# Patient Record
Sex: Female | Born: 1970 | Race: White | Hispanic: No | Marital: Married | Smoking: Former smoker
Health system: Southern US, Community
[De-identification: ages and names within clinical notes are randomized; demographics above are authoritative.]

## PROBLEM LIST (undated history)

## (undated) DIAGNOSIS — Z8619 Personal history of other infectious and parasitic diseases: Secondary | ICD-10-CM

## (undated) DIAGNOSIS — M5030 Other cervical disc degeneration, unspecified cervical region: Secondary | ICD-10-CM

## (undated) DIAGNOSIS — G43909 Migraine, unspecified, not intractable, without status migrainosus: Secondary | ICD-10-CM

## (undated) DIAGNOSIS — Z8489 Family history of other specified conditions: Secondary | ICD-10-CM

## (undated) DIAGNOSIS — R87619 Unspecified abnormal cytological findings in specimens from cervix uteri: Secondary | ICD-10-CM

## (undated) DIAGNOSIS — F32A Depression, unspecified: Secondary | ICD-10-CM

## (undated) DIAGNOSIS — R112 Nausea with vomiting, unspecified: Secondary | ICD-10-CM

## (undated) DIAGNOSIS — F329 Major depressive disorder, single episode, unspecified: Secondary | ICD-10-CM

## (undated) DIAGNOSIS — R42 Dizziness and giddiness: Secondary | ICD-10-CM

## (undated) DIAGNOSIS — T753XXA Motion sickness, initial encounter: Secondary | ICD-10-CM

## (undated) DIAGNOSIS — Z9889 Other specified postprocedural states: Secondary | ICD-10-CM

## (undated) DIAGNOSIS — N63 Unspecified lump in unspecified breast: Secondary | ICD-10-CM

## (undated) DIAGNOSIS — F419 Anxiety disorder, unspecified: Secondary | ICD-10-CM

## (undated) DIAGNOSIS — N809 Endometriosis, unspecified: Secondary | ICD-10-CM

## (undated) DIAGNOSIS — T7840XA Allergy, unspecified, initial encounter: Secondary | ICD-10-CM

## (undated) DIAGNOSIS — M5020 Other cervical disc displacement, unspecified cervical region: Secondary | ICD-10-CM

## (undated) DIAGNOSIS — N926 Irregular menstruation, unspecified: Secondary | ICD-10-CM

## (undated) HISTORY — PX: CERVIX LESION DESTRUCTION: SHX591

## (undated) HISTORY — DX: Major depressive disorder, single episode, unspecified: F32.9

## (undated) HISTORY — DX: Unspecified abnormal cytological findings in specimens from cervix uteri: R87.619

## (undated) HISTORY — DX: Allergy, unspecified, initial encounter: T78.40XA

## (undated) HISTORY — PX: BILATERAL SALPINGOOPHORECTOMY: SHX1223

## (undated) HISTORY — DX: Depression, unspecified: F32.A

## (undated) HISTORY — DX: Anxiety disorder, unspecified: F41.9

## (undated) HISTORY — DX: Unspecified lump in unspecified breast: N63.0

## (undated) HISTORY — DX: Endometriosis, unspecified: N80.9

## (undated) HISTORY — DX: Personal history of other infectious and parasitic diseases: Z86.19

## (undated) HISTORY — PX: LAPAROSCOPIC SUPRACERVICAL HYSTERECTOMY: SUR797

## (undated) HISTORY — DX: Irregular menstruation, unspecified: N92.6

---

## 1998-07-14 HISTORY — PX: REDUCTION MAMMAPLASTY: SUR839

## 1998-07-14 HISTORY — PX: BREAST ENHANCEMENT SURGERY: SHX7

## 2002-07-14 HISTORY — PX: LEEP: SHX91

## 2003-02-20 HISTORY — PX: COLPOSCOPY: SHX161

## 2006-07-14 HISTORY — PX: TUBAL LIGATION: SHX77

## 2006-09-10 ENCOUNTER — Ambulatory Visit: Payer: Self-pay | Admitting: Unknown Physician Specialty

## 2006-09-10 HISTORY — PX: HYSTEROSCOPY: SHX211

## 2006-09-10 HISTORY — PX: ENDOMETRIAL ABLATION: SHX621

## 2006-09-10 HISTORY — PX: TUBAL LIGATION: SHX77

## 2006-11-03 HISTORY — PX: LARYNGOSCOPY: SUR817

## 2007-08-02 ENCOUNTER — Ambulatory Visit: Payer: Self-pay | Admitting: Unknown Physician Specialty

## 2007-08-18 ENCOUNTER — Ambulatory Visit: Payer: Self-pay | Admitting: Family Medicine

## 2009-06-27 ENCOUNTER — Ambulatory Visit: Payer: Self-pay

## 2009-08-13 HISTORY — PX: MM BREAST STEREO BIOPSY LEFT (ARMC HX): HXRAD1824

## 2009-09-11 DIAGNOSIS — Z87891 Personal history of nicotine dependence: Secondary | ICD-10-CM | POA: Insufficient documentation

## 2009-09-19 DIAGNOSIS — H60339 Swimmer's ear, unspecified ear: Secondary | ICD-10-CM | POA: Insufficient documentation

## 2011-08-01 ENCOUNTER — Ambulatory Visit: Payer: Self-pay | Admitting: Family Medicine

## 2011-08-02 ENCOUNTER — Emergency Department: Payer: Self-pay | Admitting: Emergency Medicine

## 2011-08-02 LAB — URINALYSIS, COMPLETE
Bacteria: NONE SEEN
Bilirubin,UR: NEGATIVE
Blood: NEGATIVE
Glucose,UR: NEGATIVE mg/dL (ref 0–75)
Ketone: NEGATIVE
Protein: NEGATIVE
Specific Gravity: 1.005 (ref 1.003–1.030)
WBC UR: NONE SEEN /HPF (ref 0–5)

## 2011-08-02 LAB — COMPREHENSIVE METABOLIC PANEL
Albumin: 4.1 g/dL (ref 3.4–5.0)
Alkaline Phosphatase: 30 U/L — ABNORMAL LOW (ref 50–136)
Anion Gap: 12 (ref 7–16)
BUN: 11 mg/dL (ref 7–18)
Calcium, Total: 8.6 mg/dL (ref 8.5–10.1)
Chloride: 104 mmol/L (ref 98–107)
Creatinine: 0.8 mg/dL (ref 0.60–1.30)
EGFR (African American): >60
EGFR (Non-African Amer.): >60
Glucose: 90 mg/dL (ref 65–99)
Osmolality: 286 (ref 275–301)
Potassium: 3.7 mmol/L (ref 3.5–5.1)
SGOT(AST): 21 U/L (ref 15–37)
Sodium: 144 mmol/L (ref 136–145)
Total Protein: 6.9 g/dL (ref 6.4–8.2)

## 2011-08-02 LAB — CBC
HCT: 38.3 % (ref 35.0–47.0)
HGB: 13 g/dL (ref 12.0–16.0)
MCV: 87 fL (ref 80–100)
RBC: 4.4 10*6/uL (ref 3.80–5.20)
RDW: 13.4 % (ref 11.5–14.5)
WBC: 3.9 10*3/uL (ref 3.6–11.0)

## 2011-08-02 LAB — PREGNANCY, URINE: Pregnancy Test, Urine: NEGATIVE m[IU]/mL

## 2011-08-13 ENCOUNTER — Ambulatory Visit: Payer: Self-pay | Admitting: General Surgery

## 2011-08-13 HISTORY — PX: APPENDECTOMY: SHX54

## 2011-08-14 LAB — PATHOLOGY REPORT

## 2011-09-18 ENCOUNTER — Ambulatory Visit: Payer: Self-pay | Admitting: Unknown Physician Specialty

## 2011-11-06 ENCOUNTER — Ambulatory Visit: Payer: Self-pay | Admitting: Obstetrics & Gynecology

## 2011-11-06 LAB — CBC
MCH: 29.9 pg (ref 26.0–34.0)
MCV: 87 fL (ref 80–100)
Platelet: 219 10*3/uL (ref 150–440)
WBC: 3.7 10*3/uL (ref 3.6–11.0)

## 2011-11-12 HISTORY — PX: ABDOMINAL HYSTERECTOMY: SHX81

## 2011-11-14 ENCOUNTER — Ambulatory Visit: Payer: Self-pay | Admitting: Obstetrics & Gynecology

## 2011-11-15 LAB — HEMOGLOBIN: HGB: 11.7 g/dL — ABNORMAL LOW (ref 12.0–16.0)

## 2014-11-05 NOTE — Op Note (Signed)
PATIENT NAME:  Joanna Cervantes, Joanna Cervantes MR#:  [id number] DATE OF BIRTH:  [birth year]  DATE OF PROCEDURE:  08/13/2011  PREOPERATIVE DIAGNOSIS: Right lower quadrant pain.   POSTOPERATIVE DIAGNOSES: 1. Left side endometriosis.  2. Right ovarian cyst.   OPERATIVE PROCEDURES:  1. Diagnostic laparoscopy.  2. Drainage of right ovarian cyst.  3. Incidental appendectomy.   SURGEON: Robert Bellow, MD  ANESTHESIA: General endotracheal under Dr. Kayleen Memos.   ESTIMATED BLOOD LOSS: 10 mL.   CLINICAL NOTE: This 44 year old woman has had two weeks of right lower quadrant pain. CT scan did not show evidence of appendicitis. The patient's pain has been persistent and it was elected to proceed to diagnostic laparoscopy to help elucidate the source of her pain. Plans were for elective appendectomy to minimize any future confusion.   OPERATIVE NOTE: With the patient under adequate general endotracheal anesthesia, the abdomen was prepped with ChloraPrep and draped. Prior to prepping, a Betadine soaked sponge stick was placed into the vagina for uterine manipulation if needed. With the patient in the supine position an incision was made in the inferior aspect of the umbilicus. The fascia was incised sharply and a 12 [place] Hassan cannula passed intra-abdominally without incident. The abdomen was then insufflated with CO2 at 10 mmHg pressure. A 5 mm hypogastric step port was placed and later a 5 mm left lateral abdominal wall step port placed under direct vision. Moving the camera to the lower port showed no evidence of injury from initial port placement.   There was a small amount of free clear fluid in the pelvis. The right ovary had a walnut size cystic lesion with a hemorrhagic surface. The cyst contents were removed with blunt dissection and irrigation. Hemostasis along the thick-walled cyst edge was with electrocautery. Examination of the broad ligament on the right side did not show evidence of endometriosis. The  left tube and ovary was examined and there was a small foci of endometriosis on the inferior aspect of the left broad ligament. Bilateral tubal division was evident.   The appendix was identified. The cecum was noted to be at the midline just above the bladder. The tip of the appendix was somewhat contracted but there was no evidence of acute appendicitis. The base of the appendix was cleared and divided with two applications of a blue GIA cartridge. The mesoappendix was divided with a white vascular cartridge. The appendix was placed into an Endo Catch bag and then delivered through the umbilical port site. The abdomen was then irrigated with lactated Ringer's solution. Good hemostasis was noted at the site of the ovary area and cyst drainage. The abdomen was then desufflated and ports removed under direct vision.   The fascia at the umbilicus was closed with an 0 Maxon figure-of-eight suture. Skin incisions were closed with 4-0 Vicryl subcuticular sutures. Benzoin, Steri-Strips, Telfa, and Tegaderm dressings were applied.   The vaginal sponge stick was removed and the patient taken to the recovery room in stable condition.   ____________________________ Robert Bellow, MD jwb:cms D: 08/13/2011 15:18:01 ET T: 08/13/2011 15:58:10 ET JOB#: [id number]  cc: Robert Bellow, MD, <Dictator> R. Barnett Applebaum, MD Eusebio Me, MD Kirstie Peri Caryn Section, MD Jocelyn Lowery Amedeo Kinsman MD ELECTRONICALLY SIGNED 08/14/2011 16:01

## 2014-11-05 NOTE — Op Note (Signed)
PATIENT NAME:  Joanna Cervantes, Joanna Cervantes MR#:  [id number] DATE OF BIRTH:  1970/11/13  DATE OF PROCEDURE:  11/14/2011  PREOPERATIVE DIAGNOSIS: Endometriosis, chronic pelvic pain.   POSTOPERATIVE DIAGNOSIS: Endometriosis, chronic pelvic pain.   PROCEDURE PERFORMED: Laparoscopic supracervical hysterectomy with bilateral salpingo-oophorectomy.   SURGEON: Glean Salen, M.D.   ANESTHESIA: General.   ESTIMATED BLOOD LOSS: Minimal.   COMPLICATIONS: None.   FINDINGS: Endometriosis particularly in the left uterosacral ligament area as well as with the ovary.   DISPOSITION: To the recovery room in stable condition.   TECHNIQUE: The patient is prepped and draped in the usual sterile fashion after adequate anesthesia is obtained in the dorsal lithotomy position. A sponge stick is placed per vagina and a Foley catheter is also inserted. Attention is then turned to the abdomen where a Veress needle is inserted through a 5 mm infraumbilical incision after Marcaine is used to anesthetize the skin. Veress needle placement is confirmed using the hanging drop technique and the abdomen is then insufflated with CO2 gas. A 5 mm trocar is then inserted under direct visualization with the laparoscope with no injuries or bleeding noted. The patient is placed in Trendelenburg positioning. A 5 mm trocar is placed in the left lower quadrant and an 11 mm trocar is placed in the right lower quadrant lateral to the inferior epigastric blood vessels with no injuries or bleeding noted. The uterus is grasped as well as the adnexa. The infundibulopelvic blood vessels and ligaments are carefully coagulated and cut on each side. Dissection is carried to excise the tube and ovary to the level of the uterus and then the cardinal ligament and broad ligament complex is dissected down to the level of the uterine arteries. The uterine arteries are cauterized using bipolar cautery device. The uterus is then amputated at the level of the cervix.  The endocervical canal is cauterized. An approximate 1 cm of cervix is left in place.   A morcellator device is placed through the right lower quadrant incision and the uterus and adnexa is removed by morcellation process. The pelvic cavity is irrigated with aspiration of all fluid. Excellent hemostasis is noted. No injuries to ureter, bladder, or bowel is noted. Gas is expelled and trocars are removed from the abdomen. The right lower quadrant fascia is closed with a 0 Vicryl suture. Subcutaneous tissues are closed with Dermabond over the skin. Sponge stick is removed. Foley catheter is left in place. The patient goes to the recovery room in stable condition. All sponge, instrument, and needle counts are correct.  ____________________________ R. Barnett Applebaum, MD rph:slb D: 11/14/2011 15:56:39 ET T: 11/14/2011 16:04:10 ET JOB#: [id number]  cc: Glean Salen, MD, <Dictator> Gae Dry MD ELECTRONICALLY SIGNED 11/18/2011 23:27

## 2015-03-01 ENCOUNTER — Other Ambulatory Visit: Payer: Self-pay | Admitting: Obstetrics & Gynecology

## 2015-03-01 DIAGNOSIS — N6320 Unspecified lump in the left breast, unspecified quadrant: Secondary | ICD-10-CM

## 2015-03-09 ENCOUNTER — Ambulatory Visit
Admission: RE | Admit: 2015-03-09 | Discharge: 2015-03-09 | Disposition: A | Discharge status: Home / Self Care | Payer: BC Managed Care – PPO | Source: Ambulatory Visit | Attending: Obstetrics & Gynecology | Admitting: Obstetrics & Gynecology

## 2015-03-09 ENCOUNTER — Other Ambulatory Visit: Payer: Self-pay | Admitting: Obstetrics & Gynecology

## 2015-03-09 DIAGNOSIS — N6320 Unspecified lump in the left breast, unspecified quadrant: Secondary | ICD-10-CM

## 2015-03-09 DIAGNOSIS — N63 Unspecified lump in breast: Secondary | ICD-10-CM | POA: Insufficient documentation

## 2015-04-26 ENCOUNTER — Other Ambulatory Visit: Payer: Self-pay | Admitting: *Deleted

## 2015-04-26 ENCOUNTER — Inpatient Hospital Stay
Admission: RE | Admit: 2015-04-26 | Discharge: 2015-04-26 | Disposition: A | Discharge status: Home / Self Care | Payer: Self-pay | Source: Ambulatory Visit | Attending: *Deleted | Admitting: *Deleted

## 2015-04-26 DIAGNOSIS — Z9289 Personal history of other medical treatment: Secondary | ICD-10-CM

## 2015-07-19 ENCOUNTER — Ambulatory Visit (INDEPENDENT_AMBULATORY_CARE_PROVIDER_SITE_OTHER): Payer: BC Managed Care – PPO | Admitting: Family Medicine

## 2015-07-19 ENCOUNTER — Encounter: Payer: Self-pay | Admitting: Family Medicine

## 2015-07-19 VITALS — BP 100/70 | HR 74 | Temp 98.1°F | Resp 16 | Wt 117.0 lb

## 2015-07-19 DIAGNOSIS — N809 Endometriosis, unspecified: Secondary | ICD-10-CM | POA: Insufficient documentation

## 2015-07-19 DIAGNOSIS — F339 Major depressive disorder, recurrent, unspecified: Secondary | ICD-10-CM | POA: Diagnosis not present

## 2015-07-19 DIAGNOSIS — J309 Allergic rhinitis, unspecified: Secondary | ICD-10-CM | POA: Insufficient documentation

## 2015-07-19 DIAGNOSIS — K120 Recurrent oral aphthae: Secondary | ICD-10-CM | POA: Insufficient documentation

## 2015-07-19 DIAGNOSIS — F330 Major depressive disorder, recurrent, mild: Secondary | ICD-10-CM | POA: Insufficient documentation

## 2015-07-19 DIAGNOSIS — G43909 Migraine, unspecified, not intractable, without status migrainosus: Secondary | ICD-10-CM | POA: Insufficient documentation

## 2015-07-19 DIAGNOSIS — Z8619 Personal history of other infectious and parasitic diseases: Secondary | ICD-10-CM | POA: Insufficient documentation

## 2015-07-19 DIAGNOSIS — F329 Major depressive disorder, single episode, unspecified: Secondary | ICD-10-CM | POA: Insufficient documentation

## 2015-07-19 DIAGNOSIS — L02519 Cutaneous abscess of unspecified hand: Secondary | ICD-10-CM | POA: Insufficient documentation

## 2015-07-19 MED ORDER — VENLAFAXINE HCL ER 37.5 MG PO CP24: 37.5000 mg | ORAL_CAPSULE | Freq: Every day | ORAL | Status: DC

## 2015-07-19 NOTE — Progress Notes (Signed)
       Patient: Joanna Cervantes Female    DOB: [birth year]   45 y.o.   MRN: [id number] Visit Date: 07/19/2015  Today's Provider: Lelon Huh, MD   Chief Complaint  Patient presents with  . Follow-up   Subjective:    HPI  She reports she was diagnosed with MDD about 15 years ago before moving to [place]. She underwent counseling and tried several antidepressants before starting Effexor XR which worked very well for her. She took this for about a year, but stopped when she moved to [place] since she was doing so well. She states about three years ago she starting experiencing episodes of depression again, and has since starting seeing [name]. Depressive symptoms have since progressed the point she struggling to get through work and ADLs and she and her counselor feels it is time to start back on anti-depressant medication.     Allergies  Allergen Reactions  . Aspirin Hives  . Bupropion Hcl   . Bupropion Rash and Swelling   Previous Medications   CETIRIZINE HCL (ZYRTEC ALLERGY) 10 MG CAPS    Take 1 capsule by mouth daily.   ESTRADIOL (VIVELLE-DOT) 0.1 MG/24HR PATCH    Place 1 patch onto the skin. Every other week   FLUTICASONE (FLONASE) 50 MCG/ACT NASAL SPRAY    Place 2 sprays into the nose daily.    Review of Systems  Constitutional: Negative for fever, chills, appetite change and fatigue.  Respiratory: Negative for chest tightness and shortness of breath.   Cardiovascular: Negative for chest pain and palpitations.  Gastrointestinal: Negative for nausea, vomiting and abdominal pain.  Neurological: Negative for dizziness and weakness.  Psychiatric/Behavioral: Positive for sleep disturbance.    Social History  Substance Use Topics  . Smoking status: Former Research scientist (life sciences)  . Smokeless tobacco: Not on file  . Alcohol Use: 0.0 oz/week    0 Standard drinks or equivalent per week   Objective:   BP 100/70 mmHg  Pulse 74  Temp(Src) 98.1 F (36.7 C) (Oral)  Resp 16  Wt  117 lb (53.071 kg)  SpO2 100%  Physical Exam  General appearance: alert, well developed, well nourished, cooperative and in no distress Head: Normocephalic, without obvious abnormality, atraumatic Lungs: Respirations even and unlabored Extremities: No gross deformities Skin: Skin color, texture, turgor normal. No rashes seen  Psych: Appropriate mood and affect. Neurologic: Mental status: Alert, oriented to person, place, and time, thought content appropriate.  Depression screen PHQ 2/9 07/19/2015  Decreased Interest 2  Down, Depressed, Hopeless 3  PHQ - 2 Score 5  Altered sleeping 1  Tired, decreased energy 3  Change in appetite 0  Feeling bad or failure about yourself  1  Trouble concentrating 2  Moving slowly or fidgety/restless 3  Suicidal thoughts 0  PHQ-9 Score 15  Difficult doing work/chores Somewhat difficult        Assessment & Plan:     1. Episode of recurrent major depressive disorder, unspecified depression episode severity ([place]) Did well with Effexor in the past which we will restart. Follow up with [name] in the next couple of weeks and here in 3 weeks. Call if unable to tolerate Effexor. XR - venlafaxine XR (EFFEXOR XR) 37.5 MG 24 hr capsule; Take 1 capsule (37.5 mg total) by mouth daily with breakfast.  Dispense: 30 capsule; Refill: 1       Lelon Huh, MD  [facility]

## 2015-07-24 ENCOUNTER — Encounter: Payer: Self-pay | Admitting: Family Medicine

## 2015-08-09 ENCOUNTER — Encounter: Payer: Self-pay | Admitting: Family Medicine

## 2015-08-09 ENCOUNTER — Ambulatory Visit (INDEPENDENT_AMBULATORY_CARE_PROVIDER_SITE_OTHER): Payer: BC Managed Care – PPO | Admitting: Family Medicine

## 2015-08-09 VITALS — BP 112/70 | HR 55 | Temp 98.0°F | Resp 16 | Wt 117.0 lb

## 2015-08-09 DIAGNOSIS — M542 Cervicalgia: Secondary | ICD-10-CM

## 2015-08-09 DIAGNOSIS — F339 Major depressive disorder, recurrent, unspecified: Secondary | ICD-10-CM | POA: Diagnosis not present

## 2015-08-09 MED ORDER — CYCLOBENZAPRINE HCL 5 MG PO TABS: 5.0000 mg | ORAL_TABLET | Freq: Every day | ORAL | Status: DC

## 2015-08-09 NOTE — Progress Notes (Signed)
Patient: Joanna Cervantes Female    DOB: [birth year]   45 y.o.   MRN: [id number] Visit Date: 08/09/2015  Today's Provider: Lelon Huh, MD   Chief Complaint  Patient presents with  . Depression    follow up   Subjective:    HPI  Follow up Major Depression:  Last office visit was 07/19/2015. Changes made includes restartting Effexor XR 37.65m daily. Patient was advised to follow up with CMarjie Skiffin the next couple of weeks and to follow up here in 3 weeks. Since last office visit patient reports good compliance with treatment and fair tolerance. Patient states she has felt more fatigued and had a decreased appetite since starting Effexor. She has also noticed that her skin is reacting to products that she normally uses. Patient states she is still battling Depression and anxiety but has seen some improvement since starting Effexor.  Patient states since last visit she has been seen her Counselor CMarjie Skiff6 days ago.   She states she has been having a lot of pain in the muscles in her neck the last 2 weeks which have been keeping her up at night. Advil PM helps. But still feels very tired during the day.       Allergies  Allergen Reactions  . Aspirin Hives  . Bupropion Rash and Swelling   Previous Medications   CETIRIZINE HCL (ZYRTEC ALLERGY) 10 MG CAPS    Take 1 capsule by mouth daily.   ESTRADIOL (VIVELLE-DOT) 0.1 MG/24HR PATCH    Place 1 patch onto the skin. Every other week   FLUTICASONE (FLONASE) 50 MCG/ACT NASAL SPRAY    Place 2 sprays into the nose daily.   VENLAFAXINE XR (EFFEXOR XR) 37.5 MG 24 HR CAPSULE    Take 1 capsule (37.5 mg total) by mouth daily with breakfast.    Review of Systems  Constitutional: Positive for appetite change (decreased appetite) and fatigue. Negative for fever and chills.  Respiratory: Negative for chest tightness and shortness of breath.   Cardiovascular: Negative for chest pain and palpitations.  Gastrointestinal: Negative  for nausea, vomiting and abdominal pain.  Neurological: Negative for dizziness and weakness.  Psychiatric/Behavioral: Positive for dysphoric mood. The patient is nervous/anxious.     Social History  Substance Use Topics  . Smoking status: Former Smoker -- 0.50 packs/day    Types: Cigarettes    Quit date: 07/15/2007  . Smokeless tobacco: Not on file  . Alcohol Use: 0.0 oz/week    0 Standard drinks or equivalent per week     Comment: occasional use   Objective:   BP 112/70 mmHg  Pulse 55  Temp(Src) 98 F (36.7 C) (Oral)  Resp 16  Wt 117 lb (53.071 kg)  SpO2 100%  Physical Exam  General appearance: alert, well developed, well nourished, cooperative and in no distress Head: Normocephalic, without obvious abnormality, atraumatic Lungs: Respirations even and unlabored Extremities: No gross deformities Skin: Skin color, texture, turgor normal. No rashes seen  Psych: Appropriate mood and affect. Neurologic: Mental status: Alert, oriented to person, place, and time, thought content appropriate. MS: Mild tenderness right trapezius. FROM neck     Assessment & Plan:     1. Episode of recurrent major depressive disorder, unspecified depression episode severity ([place] Starting to improve on Effexor XR which some side effects. Her fatigue is likely exacerbated by neck pain keeping her awake at night. Continue 37.512mfor now and follow up in 1 month.  2. Neck pain Mainly at night. She is planning on starting message therapy and will try cyclobenzaprine 2m at night.   Return in about 5 weeks (around 09/13/2015).        DLelon Huh MD  [facility]

## 2015-08-20 ENCOUNTER — Other Ambulatory Visit: Payer: Self-pay | Admitting: Family Medicine

## 2015-09-06 ENCOUNTER — Ambulatory Visit
Admission: RE | Admit: 2015-09-06 | Discharge: 2015-09-06 | Disposition: A | Discharge status: Home / Self Care | Payer: BC Managed Care – PPO | Source: Ambulatory Visit | Attending: Family Medicine | Admitting: Family Medicine

## 2015-09-06 ENCOUNTER — Encounter: Payer: Self-pay | Admitting: Family Medicine

## 2015-09-06 ENCOUNTER — Ambulatory Visit (INDEPENDENT_AMBULATORY_CARE_PROVIDER_SITE_OTHER): Payer: BC Managed Care – PPO | Admitting: Family Medicine

## 2015-09-06 VITALS — BP 102/70 | HR 61 | Temp 97.7°F | Resp 16 | Ht 66.0 in | Wt 114.0 lb

## 2015-09-06 DIAGNOSIS — M542 Cervicalgia: Secondary | ICD-10-CM

## 2015-09-06 DIAGNOSIS — F339 Major depressive disorder, recurrent, unspecified: Secondary | ICD-10-CM

## 2015-09-06 DIAGNOSIS — M4802 Spinal stenosis, cervical region: Secondary | ICD-10-CM | POA: Diagnosis not present

## 2015-09-06 NOTE — Progress Notes (Signed)
Patient: Joanna Cervantes Female    DOB: 10/21/1970   45 y.o.   MRN: [id number] Visit Date: 09/06/2015  Today's Provider: Lelon Huh, MD   Chief Complaint  Patient presents with  . Depression    follow up  . Neck Pain    follow up   Subjective:    HPI  Follow up Depression: Last office visit was 08/09/2015. No changes were made. Patient was to continue Effexor XR 37.64m since she was having some mild side effects at that time. Today patient states the side effects she was experiencing at her last visit have decreased significantly and has noticed a significant improvement in depression over the last few weeks. She continues to follow up with her therapist at least monthly.     Follow up Neck pain: Last office visit was 08/09/2015. Durning that visit, patient was start message therapy and try Cyclobenzaprine 570mat night.  Today patient comes in stating her neck pain is persistent. She has tried the massage therapy which helped a lot with tightness in muscle of neck but continue to have pain over mid-cervical spine.  Has had tw Chiropractor treatments which did not helps. Has prescription for 8004mbuprofen which helps quite a bit but only for a few hours.      Allergies  Allergen Reactions  . Aspirin Hives  . Bupropion Rash and Swelling   Previous Medications   CETIRIZINE HCL (ZYRTEC ALLERGY) 10 MG CAPS    Take 1 capsule by mouth daily.   CYCLOBENZAPRINE (FLEXERIL) 5 MG TABLET    Take 1-2 tablets (5-10 mg total) by mouth at bedtime.   ESTRADIOL (VIVELLE-DOT) 0.1 MG/24HR PATCH    Place 1 patch onto the skin. Every other week   FLUTICASONE (FLONASE) 50 MCG/ACT NASAL SPRAY    Place 2 sprays into the nose daily.   VENLAFAXINE XR (EFFEXOR-XR) 37.5 MG 24 HR CAPSULE    TAKE 1 CAPSULE BY MOUTH EVERY DAY WITH BREAKFAST    Review of Systems  Constitutional: Negative for fever, chills, appetite change and fatigue.  Respiratory: Negative for chest tightness and shortness of  breath.   Cardiovascular: Negative for chest pain and palpitations.  Gastrointestinal: Negative for nausea, vomiting and abdominal pain.  Musculoskeletal: Positive for neck pain.  Neurological: Negative for dizziness and weakness.  Psychiatric/Behavioral: Positive for dysphoric mood (improved since last visit).    Social History  Substance Use Topics  . Smoking status: Former Smoker -- 0.50 packs/day    Types: Cigarettes    Quit date: 07/15/2007  . Smokeless tobacco: Not on file  . Alcohol Use: 0.0 oz/week    0 Standard drinks or equivalent per week     Comment: occasional use   Objective:   BP 102/70 mmHg  Pulse 61  Temp(Src) 97.7 F (36.5 C) (Oral)  Resp 16  Ht 5' 6"  (1.676 m)  Wt 114 lb (51.71 kg)  BMI 18.41 kg/m2  SpO2 100%  Physical Exam  General appearance: alert, well developed, well nourished, cooperative and in no distress Neck: Tender over mid cervical spine just to the right of midline.  Extremities: No gross deformities  Psych: Appropriate mood and affect. Neurologic: Mental status: Alert, oriented to person, place, and time, thought content appropriate.     Assessment & Plan:     1. Neck pain Persistent and not responding to chiropractic treatments. Obtain Xrays. Consider long acting NSAIDs or referral depending on xray result.  - DG Cervical Spine  Complete; Future  2. Episode of recurrent major depressive disorder, unspecified depression episode severity ([place]) Improving and tolerating Effexor well. Continue regular follow up with therapist. Follow up 5-6 months, sooner if not continuing to improve.         Lelon Huh, MD  [facility]

## 2015-09-07 ENCOUNTER — Other Ambulatory Visit: Payer: Self-pay | Admitting: *Deleted

## 2015-09-07 MED ORDER — CELECOXIB 100 MG PO CAPS: 100.0000 mg | ORAL_CAPSULE | Freq: Two times a day (BID) | ORAL | Status: DC

## 2015-09-07 NOTE — Telephone Encounter (Signed)
-----   Message from Birdie Sons, MD sent at 09/07/2015 11:12 AM EST ----- There is a some narrowing between 5th and 6th cervical vertebrae, otherwise normal. This could be causing some inflammation and compression of disk. Recommend she start taking meloxicam 70m once a day, #30, rf x 1. Take this every day for the next 2 weeks, then prn if doing better. If not improving within i2 weeks then i recommend physical therapy.

## 2015-09-07 NOTE — Telephone Encounter (Signed)
Patient was notified of results. Patient expressed understanding.

## 2015-09-07 NOTE — Telephone Encounter (Signed)
Rx sent to pharmacy. Patient was notified.

## 2015-09-07 NOTE — Telephone Encounter (Signed)
Patient stated that she has taken meloxicam in the past and it upset her stomach. She wants to know if there is something else she can try that isn't an NSAID?

## 2015-09-07 NOTE — Telephone Encounter (Signed)
Celebrex is much easier on stomach than traditional NSAIDs. Can try Celebrex 165m one tablet twice a day, #60, no refills.

## 2015-11-14 ENCOUNTER — Other Ambulatory Visit: Payer: Self-pay | Admitting: Family Medicine

## 2015-12-07 ENCOUNTER — Other Ambulatory Visit: Payer: Self-pay | Admitting: Physical Medicine and Rehabilitation

## 2015-12-07 DIAGNOSIS — M5412 Radiculopathy, cervical region: Secondary | ICD-10-CM

## 2015-12-27 ENCOUNTER — Ambulatory Visit
Admission: RE | Admit: 2015-12-27 | Discharge: 2015-12-27 | Disposition: A | Discharge status: Home / Self Care | Payer: BC Managed Care – PPO | Source: Ambulatory Visit | Attending: Physical Medicine and Rehabilitation | Admitting: Physical Medicine and Rehabilitation

## 2015-12-27 DIAGNOSIS — M5412 Radiculopathy, cervical region: Secondary | ICD-10-CM | POA: Diagnosis not present

## 2015-12-27 DIAGNOSIS — M50222 Other cervical disc displacement at C5-C6 level: Secondary | ICD-10-CM | POA: Diagnosis not present

## 2015-12-27 DIAGNOSIS — M50221 Other cervical disc displacement at C4-C5 level: Secondary | ICD-10-CM | POA: Diagnosis not present

## 2016-02-05 ENCOUNTER — Encounter: Payer: Self-pay | Admitting: Family Medicine

## 2016-02-05 ENCOUNTER — Ambulatory Visit (INDEPENDENT_AMBULATORY_CARE_PROVIDER_SITE_OTHER): Payer: BC Managed Care – PPO | Admitting: Family Medicine

## 2016-02-05 VITALS — BP 100/60 | HR 82 | Temp 98.2°F | Resp 16 | Ht 66.0 in | Wt 118.0 lb

## 2016-02-05 DIAGNOSIS — F339 Major depressive disorder, recurrent, unspecified: Secondary | ICD-10-CM

## 2016-02-05 DIAGNOSIS — H6982 Other specified disorders of Eustachian tube, left ear: Secondary | ICD-10-CM | POA: Diagnosis not present

## 2016-02-05 NOTE — Progress Notes (Signed)
Patient: Joanna Cervantes Female    DOB: 1970-08-07   45 y.o.   MRN: [id number] Visit Date: 02/05/2016  Today's Provider: Lelon Huh, MD   Chief Complaint  Patient presents with  . Follow-up  . Depression   Subjective:    HPI  Episode of recurrent major depressive disorder, unspecified depression episode severity ([place]): Follow up from 09/06/2015. Has been doing well since being on low dose Effexor. Continues to follow up with counselor [name] who she is now seeing once a month. She had previously taken Effexor years ago, but states she now feels that she was never really well when she stopped the medications. Her husband has even commented so. She feels she is now doing great and she should probably just stay on it indefinitely.   . Wt Readings from Last 3 Encounters:  02/05/16 118 lb (53.5 kg)  09/06/15 114 lb (51.7 kg)  08/09/15 117 lb (53.1 kg)     Allergies  Allergen Reactions  . Aspirin Hives  . Bupropion Rash and Swelling   Current Meds  Medication Sig  . Cetirizine HCl (ZYRTEC ALLERGY) 10 MG CAPS Take 1 capsule by mouth daily.  . cyclobenzaprine (FLEXERIL) 5 MG tablet Take 1-2 tablets (5-10 mg total) by mouth at bedtime.  Marland Kitchen estradiol (VIVELLE-DOT) 0.1 MG/24HR patch Place 1 patch onto the skin. Every other week  . fluticasone (FLONASE) 50 MCG/ACT nasal spray Place 2 sprays into the nose daily.  Marland Kitchen gabapentin (NEURONTIN) 100 MG capsule Take 100 mg by mouth. 2 po qhs x4, then bid  . venlafaxine XR (EFFEXOR-XR) 37.5 MG 24 hr capsule TAKE 1 CAPSULE EVERY DAY WITH BREAKFAST    Review of Systems  Constitutional: Negative for appetite change, chills, fatigue and fever.  Respiratory: Negative for chest tightness and shortness of breath.   Cardiovascular: Negative for chest pain and palpitations.  Gastrointestinal: Negative for abdominal pain, nausea and vomiting.  Neurological: Negative for dizziness and weakness.    Social History  Substance Use  Topics  . Smoking status: Former Smoker    Packs/day: 0.50    Types: Cigarettes    Quit date: 07/15/2007  . Smokeless tobacco: Not on file  . Alcohol use 0.0 oz/week     Comment: occasional use   Objective:   BP 100/60 (BP Location: Left Arm, Patient Position: Sitting, Cuff Size: Normal)   Pulse 82   Temp 98.2 F (36.8 C) (Oral)   Resp 16   Ht 5' 6"  (1.676 m)   Wt 118 lb (53.5 kg)   SpO2 97%   BMI 19.05 kg/m   Physical Exam  General Appearance:    Alert, cooperative, no distress  HENT:   bilateral TM normal without fluid or infection  Eyes:    PERRL, conjunctiva/corneas clear, EOM's intact       Lungs:     Clear to auscultation bilaterally, respirations unlabored  Heart:    Regular rate and rhythm  Neurologic:   Awake, alert, oriented x 3. No apparent focal neurological           defect.           Assessment & Plan:     1. Episode of recurrent major depressive disorder, unspecified depression episode severity ([place]) Doing much better on on Effexor and planning on staying on medication for the foreseeable future. Continue monthly follow up with counselor.   2. Eustachian tube dysfunction, left At end of visit she states her left  ear feels  Like it is filling up every time she swims and it takes several hours for it to clear up. Is using swimmer's ear without relief. Has been more congested this summer and is on Flonase. She may want to try low dose of pseudoephedrine before swimming. Advised she could call back for ENT referral if it continues.        Lelon Huh, MD  [facility]

## 2016-02-06 ENCOUNTER — Ambulatory Visit: Payer: BC Managed Care – PPO | Admitting: Family Medicine

## 2016-07-21 ENCOUNTER — Other Ambulatory Visit: Payer: Self-pay | Admitting: Obstetrics & Gynecology

## 2016-07-21 DIAGNOSIS — N63 Unspecified lump in unspecified breast: Secondary | ICD-10-CM

## 2016-08-13 ENCOUNTER — Ambulatory Visit: Payer: BC Managed Care – PPO

## 2016-08-13 ENCOUNTER — Other Ambulatory Visit: Payer: BC Managed Care – PPO

## 2016-08-22 ENCOUNTER — Ambulatory Visit
Admission: RE | Admit: 2016-08-22 | Discharge: 2016-08-22 | Disposition: A | Discharge status: Home / Self Care | Payer: BC Managed Care – PPO | Source: Ambulatory Visit | Attending: Obstetrics & Gynecology | Admitting: Obstetrics & Gynecology

## 2016-08-22 DIAGNOSIS — N6320 Unspecified lump in the left breast, unspecified quadrant: Secondary | ICD-10-CM | POA: Insufficient documentation

## 2016-08-22 DIAGNOSIS — N63 Unspecified lump in unspecified breast: Secondary | ICD-10-CM

## 2016-10-09 ENCOUNTER — Telehealth: Payer: Self-pay

## 2016-10-09 NOTE — Telephone Encounter (Signed)
Prob OK to wait for me, and I will see her first or second day back overbook.

## 2016-10-09 NOTE — Telephone Encounter (Signed)
Pt should be seen right? The earliest opening is with SDJ on 4/10, is that to far out?

## 2016-10-09 NOTE — Telephone Encounter (Signed)
Pt calling.  She had a hyst in 2013 - cx was left.  For the past 3-4 days she has had a brown d/c.  Should she be seen or be concerned?  Please call.  [phone]

## 2016-10-09 NOTE — Telephone Encounter (Signed)
Pt is schedule 10/20/16 with Dr. Kenton Kingfisher

## 2016-10-22 ENCOUNTER — Other Ambulatory Visit: Payer: Self-pay | Admitting: Physical Medicine and Rehabilitation

## 2016-10-22 DIAGNOSIS — M25511 Pain in right shoulder: Secondary | ICD-10-CM

## 2016-10-28 ENCOUNTER — Encounter: Payer: Self-pay | Admitting: Obstetrics and Gynecology

## 2016-10-28 ENCOUNTER — Ambulatory Visit (INDEPENDENT_AMBULATORY_CARE_PROVIDER_SITE_OTHER): Payer: BC Managed Care – PPO | Admitting: Obstetrics and Gynecology

## 2016-10-28 VITALS — BP 120/70 | HR 62 | Ht 66.0 in | Wt 119.0 lb

## 2016-10-28 DIAGNOSIS — R87619 Unspecified abnormal cytological findings in specimens from cervix uteri: Secondary | ICD-10-CM | POA: Insufficient documentation

## 2016-10-28 DIAGNOSIS — N63 Unspecified lump in unspecified breast: Secondary | ICD-10-CM | POA: Insufficient documentation

## 2016-10-28 DIAGNOSIS — Z124 Encounter for screening for malignant neoplasm of cervix: Secondary | ICD-10-CM

## 2016-10-28 DIAGNOSIS — Z1151 Encounter for screening for human papillomavirus (HPV): Secondary | ICD-10-CM

## 2016-10-28 DIAGNOSIS — N950 Postmenopausal bleeding: Secondary | ICD-10-CM | POA: Diagnosis not present

## 2016-10-28 NOTE — Progress Notes (Addendum)
HPI:      [name] is a 46 y.o. G1P0010 who LMP was No LMP recorded. Patient has had a hysterectomy., presents today for unusual dark colored spotting for the past 3 wks. She had vaginal irritation without odor initially and sx resolved with yeast tx. She is s/p supracx hyst BSO 2013 due to endometriosis. She has not had any bleeding since hyst. She is on ERT and has noted breast tenderness over the past year. She denies vasomotor sx if uses correctly, but notices sx if she stops patch for a few days.  She had a neg pap about 4 1/2 yrs ago.  She is sex active and denies dyspareunia/postcoital bleeding.  No GI sx, LBP, fevers, pelvic pain.   Past Medical History:  Diagnosis Date  . Abnormal Pap smear of cervix   . Breast mass   . Depression   . Endometriosis    s/p partial hysterectomy 2013  . History of chicken pox   . Irregular menses     Past Surgical History:  Procedure Laterality Date  . ABDOMINAL HYSTERECTOMY  11/2011    not due to cancer; Endometriosis and Ovarian cyst. Still has cervix  . APPENDECTOMY  08/13/2011   Diagnostic Laproscopy, prophylactic appendectomy, and drainage of right ovarian cyst done by Dr. Bary Castilla at [facility]  . BILATERAL SALPINGOOPHORECTOMY    . BREAST ENHANCEMENT SURGERY  2000   reduction - DR. MORRISON  . CERVIX LESION DESTRUCTION    . COLPOSCOPY  02/20/2003  . ENDOMETRIAL ABLATION  09/10/2006   BVD  . HYSTEROSCOPY  09/10/2006   D&C; WITH NOVASURE AND TUBAL LIGATION; BVD  . LAPAROSCOPIC SUPRACERVICAL HYSTERECTOMY    . LARYNGOSCOPY  11/03/2006   DR MCQUEEN, SMALL CYSTIC AREA OF THE RIGHT ANTERIOR VOCAL FOLD.  Marland Kitchen LEEP  2004  . MM BREAST STEREO BIOPSY LEFT ([place] HX)  08/13/2009  . REDUCTION MAMMAPLASTY Bilateral 2000  . TUBAL LIGATION  2008   Laporoscopic tubal ligation by cautery  . TUBAL LIGATION  09/10/2006   BVD    Family History  Problem Relation Age of Onset  . Hypertension Mother   . Heart attack Maternal Grandfather     . Parkinson's disease Maternal Grandfather   . Cancer Maternal Grandfather     PROSTATE  . Heart attack Paternal Grandfather   . Lung cancer Paternal Grandfather   . Breast cancer Neg Hx      ROS:  Review of Systems  Constitutional: Negative for fever, malaise/fatigue and weight loss.  Gastrointestinal: Negative for blood in stool, constipation, diarrhea, nausea and vomiting.  Genitourinary: Negative for dysuria, flank pain, frequency, hematuria and urgency.  Musculoskeletal: Negative for back pain.  Skin: Negative for itching and rash.    Objective: BP 120/70   Pulse 62   Ht 5' 6"  (1.676 m)   Wt 119 lb (54 kg)   BMI 19.21 kg/m    Physical Exam  Genitourinary: There is no rash, tenderness, lesion or Bartholin's cyst on the right labia. There is no rash, tenderness, lesion or Bartholin's cyst on the left labia. There is bleeding in the vagina. No erythema or tenderness in the vagina. No vaginal discharge found. Right adnexum does not display mass and does not display tenderness. Left adnexum does not display mass and does not display tenderness. Cervix does not exhibit motion tenderness, discharge or friability.  Genitourinary Comments: UTERUS SURG REMOVED  Nursing note and vitals reviewed.    Assessment/Plan:  Postmenopausal bleeding -  Dark spotting for the past 3 wks. Neg cx exam. Check pap. Question related to ERT dose. Will discuss with Dr. Kenton Kingfisher. May need to decrease ERT given br tender.  Cervical cancer screening - Will call pt with results and f/u. - Plan: IGP, Aptima HPV  Screening for HPV (human papillomavirus) - Plan: IGP, Aptima HPV    F/U  Return if symptoms worsen or fail to improve.  Alicia B. Copland, PA-C 10/28/2016 11:54 AM

## 2016-10-30 ENCOUNTER — Ambulatory Visit
Admission: RE | Admit: 2016-10-30 | Discharge: 2016-10-30 | Disposition: A | Discharge status: Home / Self Care | Payer: BC Managed Care – PPO | Source: Ambulatory Visit | Attending: Physical Medicine and Rehabilitation | Admitting: Physical Medicine and Rehabilitation

## 2016-10-30 DIAGNOSIS — M25511 Pain in right shoulder: Secondary | ICD-10-CM | POA: Insufficient documentation

## 2016-10-30 DIAGNOSIS — M7581 Other shoulder lesions, right shoulder: Secondary | ICD-10-CM | POA: Insufficient documentation

## 2016-10-30 DIAGNOSIS — M7551 Bursitis of right shoulder: Secondary | ICD-10-CM | POA: Insufficient documentation

## 2016-10-30 LAB — IGP, APTIMA HPV
HPV Aptima: NEGATIVE
PAP Smear Comment: 0

## 2016-11-03 ENCOUNTER — Other Ambulatory Visit: Payer: Self-pay | Admitting: Obstetrics and Gynecology

## 2016-11-03 MED ORDER — ESTRADIOL 0.05 MG/24HR TD PTTW: 1.0000 | MEDICATED_PATCH | TRANSDERMAL | 7 refills | Status: DC

## 2016-11-03 NOTE — Progress Notes (Signed)
Pt aware of neg pap smear. Having PMB, s/p supracx hyst. Will decrease ERT dose from vivelle dot 0.075 mg to 0.05 mg. Rx eRxd. F/u prn. Pt also having breast tenderness on 0.075 mg dose. Dr. Kenton Kingfisher agrees with plan.

## 2016-11-10 ENCOUNTER — Ambulatory Visit: Payer: Self-pay | Admitting: Obstetrics & Gynecology

## 2016-11-18 ENCOUNTER — Other Ambulatory Visit: Payer: Self-pay | Admitting: Family Medicine

## 2017-02-02 ENCOUNTER — Telehealth: Payer: Self-pay

## 2017-02-02 NOTE — Telephone Encounter (Signed)
I saw your pt 4/18 for increased breast tenderness on HRT. She was also having vag spotting, s/p supracx hyst. Neg pap. We lowered ERT dose but pt still spotting monthly. Is this ok since there are probably some residual uterine cells after hyst?

## 2017-02-02 NOTE — Telephone Encounter (Signed)
Bleeding most likely from HRT, not cancer.  Est if cyclical bleeding.  Can monitor without biopsy for now.  Consider change to non-HRT remedy for menopausal sx's.

## 2017-02-02 NOTE — Telephone Encounter (Signed)
Pt seen by [facility] 10/28/16 for spotting following hysterectomy >47yr ago. She was advised to monitor spotting. She has monitored and spotting is occurring monthly. Would like advise on next step.

## 2017-02-03 MED ORDER — ESTRADIOL 0.05 MG/24HR TD PTTW: 1.0000 | MEDICATED_PATCH | TRANSDERMAL | 1 refills | Status: DC

## 2017-02-03 NOTE — Telephone Encounter (Signed)
Pt with monthly, cyclical 1-3 days light brown spotting for the past few months. She is s/p supracx hyst. Never had sx until recently. Neg pap 4/18. Doing well with decreased ERT dose 0.5 mg. Breast tenderness resolved, vasomotor sx stable. Pt to follow bleeding and f/u if sx worsen or become more frequent than once monthly.

## 2017-02-03 NOTE — Addendum Note (Signed)
Addended by: Ardeth Perfect B on: 5/46/5035 10:23 AM   Modules accepted: Orders

## 2017-02-04 ENCOUNTER — Ambulatory Visit (INDEPENDENT_AMBULATORY_CARE_PROVIDER_SITE_OTHER): Payer: BC Managed Care – PPO | Admitting: Family Medicine

## 2017-02-04 ENCOUNTER — Encounter: Payer: Self-pay | Admitting: Family Medicine

## 2017-02-04 VITALS — BP 102/64 | HR 80 | Temp 98.5°F | Resp 16 | Wt 119.0 lb

## 2017-02-04 DIAGNOSIS — F339 Major depressive disorder, recurrent, unspecified: Secondary | ICD-10-CM | POA: Diagnosis not present

## 2017-02-04 MED ORDER — VENLAFAXINE HCL ER 37.5 MG PO CP24: ORAL_CAPSULE | ORAL | 4 refills | Status: DC

## 2017-02-04 NOTE — Progress Notes (Signed)
Patient: Joanna Cervantes Female    DOB: 30-Dec-1970   46 y.o.   MRN: [id number] Visit Date: 02/04/2017  Today's Provider: Lelon Huh, MD   Chief Complaint  Patient presents with  . Depression    follow up   Subjective:    HPI Follow up of Depression:  Patient was last seen for this problem 1 year ago and no changes were made. Patient was advised to continue regular monthly follow up with counselor, who she now continues to see montly. Patient reports good compliance with treatment, good tolerance and good symptom control.     Allergies  Allergen Reactions  . Aspirin Hives  . Celecoxib Diarrhea  . Bupropion Rash and Swelling     Current Outpatient Prescriptions:  .  Cetirizine HCl (ZYRTEC ALLERGY) 10 MG CAPS, Take 1 capsule by mouth daily., Disp: , Rfl:  .  [START ON 02/05/2017] estradiol (VIVELLE-DOT) 0.05 MG/24HR patch, Place 1 patch (0.05 mg total) onto the skin 2 (two) times a week., Disp: 24 patch, Rfl: 1 .  venlafaxine XR (EFFEXOR-XR) 37.5 MG 24 hr capsule, TAKE 1 CAPSULE BY MOUTH EVERY DAY WITH BREAKFAST, Disp: 30 capsule, Rfl: 11  Review of Systems  Constitutional: Negative for appetite change, chills, fatigue and fever.  Respiratory: Negative for chest tightness and shortness of breath.   Cardiovascular: Negative for chest pain and palpitations.  Gastrointestinal: Negative for abdominal pain, nausea and vomiting.  Neurological: Negative for dizziness and weakness.  Psychiatric/Behavioral: Positive for decreased concentration and sleep disturbance. Negative for confusion, self-injury and suicidal ideas. The patient is nervous/anxious.     Social History  Substance Use Topics  . Smoking status: Former Smoker    Packs/day: 0.50    Types: Cigarettes    Quit date: 07/15/2007  . Smokeless tobacco: Never Used  . Alcohol use 0.0 oz/week     Comment: occasional use   Objective:   BP 102/64 (BP Location: Left Arm, Patient Position: Sitting, Cuff Size:  Normal)   Pulse 80   Temp 98.5 F (36.9 C) (Oral)   Resp 16   Wt 119 lb (54 kg)   SpO2 99% Comment: room air  BMI 19.21 kg/m  There were no vitals filed for this visit.    Physical Exam   General appearance: alert, well developed, well nourished, cooperative and in no distress Head: Normocephalic, without obvious abnormality, atraumatic Respiratory: Respirations even and unlabored, normal respiratory rate Extremities: No gross deformities Skin: Skin color, texture, turgor normal. No rashes seen  Psych: Appropriate mood and affect. Neurologic: Mental status: Alert, oriented to person, place, and time, thought content appropriate.  Depression screen [facility] 2/9 02/04/2017 07/19/2015  Decreased Interest 0 2  Down, Depressed, Hopeless 0 3  PHQ - 2 Score 0 5  Altered sleeping 1 1  Tired, decreased energy 1 3  Change in appetite 0 0  Feeling bad or failure about yourself  0 1  Trouble concentrating 1 2  Moving slowly or fidgety/restless 1 3  Suicidal thoughts 0 0  PHQ-9 Score 4 15  Difficult doing work/chores Not difficult at all Somewhat difficult       Assessment & Plan:     1. Episode of recurrent major depressive disorder, unspecified depression episode severity ([place]) Doing well with current dose of venlafaxine. She reports her counselor has also diagnosed her with ADD, but she is not interested in any additional medications at this time. Rf venlafaxine for 90 day prescriptions.   She  has routine HM at gyn where she states she periodically has blood work done.        Lelon Huh, MD  [facility]

## 2017-04-28 ENCOUNTER — Encounter: Payer: Self-pay | Admitting: *Deleted

## 2017-05-06 ENCOUNTER — Encounter
Admission: RE | Disposition: A | Discharge status: Home / Self Care | Payer: Self-pay | Source: Ambulatory Visit | Attending: Surgery

## 2017-05-06 ENCOUNTER — Ambulatory Visit: Payer: BC Managed Care – PPO | Admitting: Anesthesiology

## 2017-05-06 ENCOUNTER — Ambulatory Visit
Admission: RE | Admit: 2017-05-06 | Discharge: 2017-05-06 | Disposition: A | Discharge status: Home / Self Care | Payer: BC Managed Care – PPO | Source: Ambulatory Visit | Attending: Surgery | Admitting: Surgery

## 2017-05-06 DIAGNOSIS — M7541 Impingement syndrome of right shoulder: Secondary | ICD-10-CM | POA: Insufficient documentation

## 2017-05-06 DIAGNOSIS — Z87891 Personal history of nicotine dependence: Secondary | ICD-10-CM | POA: Insufficient documentation

## 2017-05-06 DIAGNOSIS — M75111 Incomplete rotator cuff tear or rupture of right shoulder, not specified as traumatic: Secondary | ICD-10-CM | POA: Diagnosis present

## 2017-05-06 DIAGNOSIS — M7521 Bicipital tendinitis, right shoulder: Secondary | ICD-10-CM | POA: Diagnosis not present

## 2017-05-06 HISTORY — DX: Dizziness and giddiness: R42

## 2017-05-06 HISTORY — DX: Other specified postprocedural states: Z98.890

## 2017-05-06 HISTORY — DX: Other cervical disc degeneration, unspecified cervical region: M50.30

## 2017-05-06 HISTORY — PX: SHOULDER ARTHROSCOPY: SHX128

## 2017-05-06 HISTORY — DX: Family history of other specified conditions: Z84.89

## 2017-05-06 HISTORY — DX: Motion sickness, initial encounter: T75.3XXA

## 2017-05-06 HISTORY — DX: Migraine, unspecified, not intractable, without status migrainosus: G43.909

## 2017-05-06 HISTORY — DX: Other specified postprocedural states: R11.2

## 2017-05-06 HISTORY — DX: Other cervical disc displacement, unspecified cervical region: M50.20

## 2017-05-06 SURGERY — ARTHROSCOPY, SHOULDER
Anesthesia: General | Laterality: Right | Wound class: Clean

## 2017-05-06 MED ORDER — FENTANYL CITRATE (PF) 100 MCG/2ML IJ SOLN
INTRAMUSCULAR | Status: DC | PRN
  Administered 2017-05-06: 100 ug via INTRAVENOUS
  Administered 2017-05-06 (×2): 25 ug via INTRAVENOUS

## 2017-05-06 MED ORDER — METOCLOPRAMIDE HCL 5 MG/ML IJ SOLN: 5.0000 mg | Freq: Three times a day (TID) | INTRAMUSCULAR | Status: DC | PRN

## 2017-05-06 MED ORDER — ACETAMINOPHEN 325 MG PO TABS: 325.0000 mg | ORAL_TABLET | ORAL | Status: DC | PRN

## 2017-05-06 MED ORDER — CEFAZOLIN SODIUM-DEXTROSE 2-4 GM/100ML-% IV SOLN
2.0000 g | Freq: Once | INTRAVENOUS | Status: AC
  Administered 2017-05-06: 2 g via INTRAVENOUS

## 2017-05-06 MED ORDER — OXYCODONE HCL 5 MG PO TABS: 5.0000 mg | ORAL_TABLET | ORAL | Status: DC | PRN

## 2017-05-06 MED ORDER — OXYCODONE HCL 5 MG PO TABS: 5.0000 mg | ORAL_TABLET | ORAL | 0 refills | Status: DC | PRN

## 2017-05-06 MED ORDER — LIDOCAINE HCL (CARDIAC) 20 MG/ML IV SOLN
INTRAVENOUS | Status: DC | PRN
  Administered 2017-05-06: 40 mg via INTRATRACHEAL

## 2017-05-06 MED ORDER — OXYCODONE HCL 5 MG PO TABS: 5.0000 mg | ORAL_TABLET | Freq: Once | ORAL | Status: DC | PRN

## 2017-05-06 MED ORDER — LACTATED RINGERS IV SOLN
INTRAVENOUS | Status: DC
  Administered 2017-05-06 (×2): via INTRAVENOUS

## 2017-05-06 MED ORDER — GLYCOPYRROLATE 0.2 MG/ML IJ SOLN
INTRAMUSCULAR | Status: DC | PRN
  Administered 2017-05-06: 0.1 mg via INTRAVENOUS

## 2017-05-06 MED ORDER — DEXAMETHASONE SODIUM PHOSPHATE 4 MG/ML IJ SOLN
INTRAMUSCULAR | Status: DC | PRN
  Administered 2017-05-06: 4 mg via INTRAVENOUS
  Administered 2017-05-06: 4 mg via PERINEURAL

## 2017-05-06 MED ORDER — ONDANSETRON HCL 4 MG/2ML IJ SOLN: 4.0000 mg | Freq: Once | INTRAMUSCULAR | Status: DC | PRN

## 2017-05-06 MED ORDER — OXYCODONE HCL 5 MG/5ML PO SOLN: 5.0000 mg | Freq: Once | ORAL | Status: DC | PRN

## 2017-05-06 MED ORDER — METOCLOPRAMIDE HCL 5 MG PO TABS: 5.0000 mg | ORAL_TABLET | Freq: Three times a day (TID) | ORAL | Status: DC | PRN

## 2017-05-06 MED ORDER — FENTANYL CITRATE (PF) 100 MCG/2ML IJ SOLN: 25.0000 ug | INTRAMUSCULAR | Status: DC | PRN

## 2017-05-06 MED ORDER — PROPOFOL 10 MG/ML IV BOLUS
INTRAVENOUS | Status: DC | PRN
  Administered 2017-05-06: 130 mg via INTRAVENOUS
  Administered 2017-05-06 (×2): 30 mg via INTRAVENOUS

## 2017-05-06 MED ORDER — POTASSIUM CHLORIDE IN NACL 20-0.9 MEQ/L-% IV SOLN: INTRAVENOUS | Status: DC

## 2017-05-06 MED ORDER — ONDANSETRON HCL 4 MG/2ML IJ SOLN
INTRAMUSCULAR | Status: DC | PRN
  Administered 2017-05-06: 4 mg via INTRAVENOUS

## 2017-05-06 MED ORDER — PROPOFOL 500 MG/50ML IV EMUL
INTRAVENOUS | Status: DC | PRN
  Administered 2017-05-06: 75 ug/kg/min via INTRAVENOUS

## 2017-05-06 MED ORDER — ONDANSETRON HCL 4 MG/2ML IJ SOLN: 4.0000 mg | Freq: Four times a day (QID) | INTRAMUSCULAR | Status: DC | PRN

## 2017-05-06 MED ORDER — MIDAZOLAM HCL 2 MG/2ML IJ SOLN
INTRAMUSCULAR | Status: DC | PRN
  Administered 2017-05-06: 1 mg via INTRAVENOUS
  Administered 2017-05-06: 2 mg via INTRAVENOUS
  Administered 2017-05-06: 1 mg via INTRAVENOUS

## 2017-05-06 MED ORDER — ONDANSETRON HCL 4 MG PO TABS: 4.0000 mg | ORAL_TABLET | Freq: Four times a day (QID) | ORAL | Status: DC | PRN

## 2017-05-06 MED ORDER — ACETAMINOPHEN 160 MG/5ML PO SOLN: 325.0000 mg | ORAL | Status: DC | PRN

## 2017-05-06 MED ORDER — BUPIVACAINE-EPINEPHRINE (PF) 0.5% -1:200000 IJ SOLN
INTRAMUSCULAR | Status: DC | PRN
  Administered 2017-05-06: 30 mL

## 2017-05-06 MED ORDER — ROPIVACAINE HCL 5 MG/ML IJ SOLN
INTRAMUSCULAR | Status: DC | PRN
  Administered 2017-05-06: 35 mL via PERINEURAL

## 2017-05-06 SURGICAL SUPPLY — 39 items
ANCHOR JUGGERKNOT WTAP NDL 2.9 (Anchor) ×2 IMPLANT
BIT DRILL JUGRKNT W/NDL BIT2.9 (DRILL) ×1 IMPLANT
BLADE FULL RADIUS 3.5 (BLADE) ×2 IMPLANT
CANNULA SHAVER 8MMX76MM (CANNULA) ×2 IMPLANT
CHLORAPREP W/TINT 26ML (MISCELLANEOUS) ×2 IMPLANT
COOLER POLAR GLACIER W/PUMP (MISCELLANEOUS) ×2 IMPLANT
COVER LIGHT HANDLE UNIVERSAL (MISCELLANEOUS) ×4 IMPLANT
COVER MAYO STAND STRL (DRAPES) ×4 IMPLANT
CUFF TOUR DL BLAD SGL PORT 18 (MISCELLANEOUS) ×2 IMPLANT
DRAPE IMP U-DRAPE 54X76 (DRAPES) ×4 IMPLANT
DRILL JUGGERKNOT W/NDL BIT 2.9 (DRILL) ×2
GAUZE PETRO XEROFOAM 1X8 (MISCELLANEOUS) ×2 IMPLANT
GAUZE SPONGE 4X4 12PLY STRL (GAUZE/BANDAGES/DRESSINGS) ×2 IMPLANT
GLOVE BIO SURGEON STRL SZ8 (GLOVE) ×4 IMPLANT
GLOVE INDICATOR 8.0 STRL GRN (GLOVE) ×2 IMPLANT
GOWN STRL REUS W/ TWL LRG LVL3 (GOWN DISPOSABLE) ×1 IMPLANT
GOWN STRL REUS W/ TWL XL LVL3 (GOWN DISPOSABLE) ×1 IMPLANT
GOWN STRL REUS W/TWL LRG LVL3 (GOWN DISPOSABLE) ×1
GOWN STRL REUS W/TWL XL LVL3 (GOWN DISPOSABLE) ×1
IV LACTATED RINGER IRRG 3000ML (IV SOLUTION) ×2
IV LR IRRIG 3000ML ARTHROMATIC (IV SOLUTION) ×2 IMPLANT
MANIFOLD 4PT FOR NEPTUNE1 (MISCELLANEOUS) ×2 IMPLANT
MAT BLUE FLOOR 46X72 FLO (MISCELLANEOUS) ×2 IMPLANT
NEEDLE HYPO 21X1.5 SAFETY (NEEDLE) ×2 IMPLANT
NEEDLE REVERSE CUT 1/2 CRC (NEEDLE) IMPLANT
PACK ARTHROSCOPY SHOULDER (MISCELLANEOUS) ×2 IMPLANT
PAD GROUND ADULT SPLIT (MISCELLANEOUS) ×2 IMPLANT
PAD WRAPON POLAR SHDR UNIV (MISCELLANEOUS) ×1 IMPLANT
SLING ULTRA II M (MISCELLANEOUS) ×2 IMPLANT
STAPLER SKIN PROX 35W (STAPLE) ×2 IMPLANT
STRAP BODY AND KNEE 60X3 (MISCELLANEOUS) ×4 IMPLANT
SUT ETHIBOND 0 MO6 C/R (SUTURE) ×2 IMPLANT
SUT VIC AB 2-0 CT1 27 (SUTURE) ×2
SUT VIC AB 2-0 CT1 TAPERPNT 27 (SUTURE) ×2 IMPLANT
TAPE MICROFOAM 4IN (TAPE) ×2 IMPLANT
TUBING ARTHRO INFLOW-ONLY STRL (TUBING) ×2 IMPLANT
TUBING CONNECTING 10 (TUBING) ×2 IMPLANT
WAND HAND CNTRL MULTIVAC 90 (MISCELLANEOUS) ×2 IMPLANT
WRAPON POLAR PAD SHDR UNIV (MISCELLANEOUS) ×2

## 2017-05-06 NOTE — Discharge Instructions (Signed)
General Anesthesia, Adult, Care After These instructions provide you with information about caring for yourself after your procedure. Your health care provider may also give you more specific instructions. Your treatment has been planned according to current medical practices, but problems sometimes occur. Call your health care provider if you have any problems or questions after your procedure. What can I expect after the procedure? After the procedure, it is common to have:  Vomiting.  A sore throat.  Mental slowness.  It is common to feel:  Nauseous.  Cold or shivery.  Sleepy.  Tired.  Sore or achy, even in parts of your body where you did not have surgery.  Follow these instructions at home: For at least 24 hours after the procedure:  Do not: ? Participate in activities where you could fall or become injured. ? Drive. ? Use heavy machinery. ? Drink alcohol. ? Take sleeping pills or medicines that cause drowsiness. ? Make important decisions or sign legal documents. ? Take care of children on your own.  Rest. Eating and drinking  If you vomit, drink water, juice, or soup when you can drink without vomiting.  Drink enough fluid to keep your urine clear or pale yellow.  Make sure you have little or no nausea before eating solid foods.  Follow the diet recommended by your health care provider. General instructions  Have a responsible adult stay with you until you are awake and alert.  Return to your normal activities as told by your health care provider. Ask your health care provider what activities are safe for you.  Take over-the-counter and prescription medicines only as told by your health care provider.  If you smoke, do not smoke without supervision.  Keep all follow-up visits as told by your health care provider. This is important. Contact a health care provider if:  You continue to have nausea or vomiting at home, and medicines are not helpful.  You  cannot drink fluids or start eating again.  You cannot urinate after 8-12 hours.  You develop a skin rash.  You have fever.  You have increasing redness at the site of your procedure. Get help right away if:  You have difficulty breathing.  You have chest pain.  You have unexpected bleeding.  You feel that you are having a life-threatening or urgent problem. This information is not intended to replace advice given to you by your health care provider. Make sure you discuss any questions you have with your health care provider. Document Released: 10/06/2000 Document Revised: 12/03/2015 Document Reviewed: 06/14/2015 Elsevier Interactive Patient Education  2018 [employer].  Keep dressing dry and intact.  May shower after dressing changed on post-op day #4 (Sunday).  Cover staples with Band-Aids after drying off. Apply ice frequently to shoulder. Take ibuprofen 600 mg TID with meals for 7-10 days, then as necessary. Take oxycodone as prescribed when needed.  May supplement with ES Tylenol if necessary. Keep shoulder immobilizer on at all times except may remove for bathing purposes. Follow-up in 10-14 days or as scheduled.

## 2017-05-06 NOTE — Anesthesia Preprocedure Evaluation (Addendum)
Anesthesia Evaluation  Patient identified by MRN, date of birth, ID band Patient awake    Reviewed: Allergy & Precautions, H&P , NPO status , Patient's Chart, lab work & pertinent test results  History of Anesthesia Complications (+) PONV and history of anesthetic complications  Airway Mallampati: I  TM Distance: >3 FB Neck ROM: full    Dental no notable dental hx.    Pulmonary former smoker,    Pulmonary exam normal breath sounds clear to auscultation       Cardiovascular Normal cardiovascular exam Rhythm:regular Rate:Normal     Neuro/Psych  Headaches, PSYCHIATRIC DISORDERS    GI/Hepatic   Endo/Other    Renal/GU      Musculoskeletal   Abdominal   Peds  Hematology   Anesthesia Other Findings   Reproductive/Obstetrics                             Anesthesia Physical Anesthesia Plan  ASA: II  Anesthesia Plan: General   Post-op Pain Management:  Regional for Post-op pain   Induction: Intravenous  PONV Risk Score and Plan: 4 or greater and Ondansetron, Dexamethasone, Midazolam, Propofol infusion and Scopolamine patch - Pre-op  Airway Management Planned: Mask  Additional Equipment:   Intra-op Plan:   Post-operative Plan:   Informed Consent: I have reviewed the patients History and Physical, chart, labs and discussed the procedure including the risks, benefits and alternatives for the proposed anesthesia with the patient or authorized representative who has indicated his/her understanding and acceptance.     Plan Discussed with: CRNA  Anesthesia Plan Comments:        Anesthesia Quick Evaluation

## 2017-05-06 NOTE — Anesthesia Procedure Notes (Signed)
Anesthesia Regional Block: Interscalene brachial plexus block   Pre-Anesthetic Checklist: ,, timeout performed, Correct Patient, Correct Site, Correct Laterality, Correct Procedure, Correct Position, site marked, Risks and benefits discussed,  Surgical consent,  Pre-op evaluation,  At surgeon's request and post-op pain management  Laterality: Right  Prep: chloraprep       Needles:  Injection technique: Single-shot  Needle Type: Stimiplex     Needle Length: 10cm  Needle Gauge: 21     Additional Needles:   Narrative:  Start time: 05/06/2017 12:23 PM End time: 05/06/2017 12:30 PM Injection made incrementally with aspirations every 5 mL.  Performed by: Personally  Anesthesiologist: Ronelle Nigh  Additional Notes: Functioning IV was confirmed and monitors applied. Ultrasound guidance: relevant anatomy identified, needle position confirmed, local anesthetic spread visualized around nerve(s)., vascular puncture avoided.  Image printed for medical record.  Negative aspiration and no paresthesias; incremental administration of local anesthetic. The patient tolerated the procedure well. Vitals signes recorded in RN notes.

## 2017-05-06 NOTE — Anesthesia Procedure Notes (Signed)
Procedure Name: LMA Insertion Date/Time: 05/06/2017 2:05 PM Performed by: Mayme Genta Pre-anesthesia Checklist: Patient identified, Emergency Drugs available, Suction available, Timeout performed and Patient being monitored Patient Re-evaluated:Patient Re-evaluated prior to induction Oxygen Delivery Method: Circle system utilized Preoxygenation: Pre-oxygenation with 100% oxygen Induction Type: IV induction LMA: LMA inserted LMA Size: 4.0 Number of attempts: 1 Placement Confirmation: positive ETCO2 and breath sounds checked- equal and bilateral Tube secured with: Tape

## 2017-05-06 NOTE — H&P (Signed)
Paper H&P to be scanned into permanent record. H&P reviewed and patient re-examined. No changes. 

## 2017-05-06 NOTE — Transfer of Care (Signed)
Immediate Anesthesia Transfer of Care Note  Patient: Joanna Cervantes  Procedure(s) Performed: ARTHROSCOPY SHOULDER WITH DEBRIDEMENT, DECOMPRESSION, ROTATOR CUFF REPAIR AND POSSIBLE BICEPS TENODESIS (Right )  Patient Location: PACU  Anesthesia Type: General  Level of Consciousness: awake, alert  and patient cooperative  Airway and Oxygen Therapy: Patient Spontanous Breathing and Patient connected to supplemental oxygen  Post-op Assessment: Post-op Vital signs reviewed, Patient's Cardiovascular Status Stable, Respiratory Function Stable, Patent Airway and No signs of Nausea or vomiting  Post-op Vital Signs: Reviewed and stable  Complications: No apparent anesthesia complications

## 2017-05-06 NOTE — Anesthesia Postprocedure Evaluation (Signed)
Anesthesia Post Note  Patient: Joanna Cervantes  Procedure(s) Performed: Limited arthroscopic debridement, arthroscopic subacromial decompression, mini-open rotator cuff repair, and mini-open biceps tenodesis, right shoulder (Right )  Patient location during evaluation: PACU Anesthesia Type: General Level of consciousness: awake and alert and oriented Pain management: satisfactory to patient Vital Signs Assessment: post-procedure vital signs reviewed and stable Respiratory status: spontaneous breathing, nonlabored ventilation and respiratory function stable Cardiovascular status: blood pressure returned to baseline and stable Postop Assessment: Adequate PO intake and No signs of nausea or vomiting Anesthetic complications: no    Raliegh Ip

## 2017-05-06 NOTE — Op Note (Signed)
05/06/2017  3:43 PM  Patient:   Joanna Cervantes  Pre-Op Diagnosis:   Impingement/tendinopathy with partial-thickness rotator cuff tear, right shoulder.  Post-Op Diagnosis: Impingement/tendinopathy with bursal partial thickness rotator cuff tear and biceps tendinopathy, right shoulder.  Procedure: Limited arthroscopic debridement, arthroscopic subacromial decompression, mini-open rotator cuff repair, and mini-open biceps tenodesis, right shoulder.  Anesthesia: General LMA with interscalene block placed preoperatively by the anesthesiologist.  Surgeon:   Pascal Lux, MD  Assistant:   None  Findings: As above.  There is a small bursal surface partial-thickness tear involving approximately 15-20% of the thickness of the tendon located at the anterior portion of the supraspinatus insertional fibers.  The biceps tendon demonstrated evidence of inflammation without partial or full-thickness tearing.  The labrum demonstrated a small area of fraying superiorly but otherwise was intact, as was the remainder of the rotator cuff.  The articular surfaces of the glenoid and humerus both were in satisfactory condition.  Complications: None  Fluids:   1000 cc  Estimated blood loss: 5 cc  Tourniquet time: None  Drains: None  Closure: Staples   Brief clinical note: The patient is a 46 year old female with a history of right shoulder pain. The patient's symptoms have progressed despite medications, activity modification, etc. The patient's history and examination are consistent with impingement/tendinopathy with a rotator cuff tear. These findings were confirmed by MRI scan. The patient presents at this time for definitive management of these shoulder symptoms.  Procedure: The patient underwent placement of an interscalene block by the anesthesiologist in the preoperative holding area before being brought into the operating room and lain in the supine position. The  patient then underwent general endotracheal intubation and anesthesia before being repositioned in the beach chair position using the beach chair positioner. The right shoulder and upper extremity were prepped with ChloraPrep solution before being draped sterilely. Preoperative antibiotics were administered. A timeout was performed to confirm the proper surgical site before the expected portal sites and incision site were injected with 0.5% Sensorcaine with epinephrine. A posterior portal was created and the glenohumeral joint thoroughly inspected with the findings as described above. An anterior portal was created using an outside-in technique. The labrum and rotator cuff were further probed, again confirming the above-noted findings.  The areas of labral fraying and synovitis were debrided back to stable margins using a full-radius resector. The ArthroCare wand was inserted and used to release the biceps tendon from its labral anchor. It also was used to obtain hemostasis as well as to "anneal" the labrum superiorly and anteriorly. The instruments were removed from the joint after suctioning the excess fluid.  The camera was repositioned through the posterior portal into the subacromial space. A separate lateral portal was created using an outside-in technique. The 3.5 mm full-radius resector was introduced and used to perform a subtotal bursectomy. The ArthroCare wand was then inserted and used to remove the periosteal tissue off the undersurface of the anterior third of the acromion as well as to recess the coracoacromial ligament from its attachment along the anterior and lateral margins of the acromion. The 4.0 mm acromionizing bur was introduced and used to complete the decompression by removing the undersurface of the anterior third of the acromion. The full radius resector was reintroduced to remove any residual bony debris before the ArthroCare wand was reintroduced to obtain hemostasis. The instruments  were then removed from the subacromial space after suctioning the excess fluid.  An approximately 4-5 cm incision was made over  the anterolateral aspect of the shoulder beginning at the anterolateral corner of the acromion and extending distally in line with the bicipital groove. This incision was carried down through the subcutaneous tissues to expose the deltoid fascia. The raphae between the anterior and middle thirds was identified and this plane developed to provide access into the subacromial space. Additional bursal tissues were debrided sharply using Metzenbaum scissors. The rotator cuff was carefully inspected and the bursal surface tear involving the anterior insertional fibers of the supraspinatus tendon was readily identified.  It measured approximately 1.2 x 0.6 cm and involved approximately 15-20% of the thickness of the tendon.  The tear was reapproximated using two #0 Ethibond interrupted sutures placed in a side-to-side fashion. An apparent watertight closure was obtained.  The bicipital groove was identified by palpation and opened for 1-1.5 cm. The biceps tendon stump was retrieved through this defect. The floor of the bicipital groove was roughened with a curet before a single Biomet 2.9 mm JuggerKnot anchor was inserted. Both sets of sutures were passed through the biceps tendon and tied securely to effect the tenodesis. The bicipital sheath was reapproximated using two #0 Ethibond interrupted sutures, incorporating the biceps tendon to further reinforce the tenodesis.  The wound was copiously irrigated with sterile saline solution before the deltoid raphae was reapproximated using 2-0 Vicryl interrupted sutures. The subcutaneous tissues were closed in two layers using 2-0 Vicryl interrupted sutures before the skin was closed using staples. The portal sites also were closed using staples. A sterile bulky dressing was applied to the shoulder before the arm was placed into a shoulder  immobilizer. The patient was then awakened, extubated, and returned to the recovery room in satisfactory condition after tolerating the procedure well.

## 2017-05-08 ENCOUNTER — Encounter: Payer: Self-pay | Admitting: Surgery

## 2017-06-11 ENCOUNTER — Encounter: Payer: Self-pay | Admitting: Obstetrics & Gynecology

## 2017-06-11 ENCOUNTER — Ambulatory Visit (INDEPENDENT_AMBULATORY_CARE_PROVIDER_SITE_OTHER): Payer: BC Managed Care – PPO | Admitting: Obstetrics & Gynecology

## 2017-06-11 VITALS — BP 120/80 | HR 81 | Ht 66.0 in | Wt 117.0 lb

## 2017-06-11 DIAGNOSIS — Z1329 Encounter for screening for other suspected endocrine disorder: Secondary | ICD-10-CM | POA: Diagnosis not present

## 2017-06-11 DIAGNOSIS — Z1321 Encounter for screening for nutritional disorder: Secondary | ICD-10-CM | POA: Diagnosis not present

## 2017-06-11 DIAGNOSIS — Z01419 Encounter for gynecological examination (general) (routine) without abnormal findings: Secondary | ICD-10-CM

## 2017-06-11 DIAGNOSIS — Z13220 Encounter for screening for lipoid disorders: Secondary | ICD-10-CM

## 2017-06-11 DIAGNOSIS — Z1239 Encounter for other screening for malignant neoplasm of breast: Secondary | ICD-10-CM

## 2017-06-11 DIAGNOSIS — Z0000 Encounter for general adult medical examination without abnormal findings: Secondary | ICD-10-CM | POA: Insufficient documentation

## 2017-06-11 DIAGNOSIS — Z1231 Encounter for screening mammogram for malignant neoplasm of breast: Secondary | ICD-10-CM

## 2017-06-11 DIAGNOSIS — Z131 Encounter for screening for diabetes mellitus: Secondary | ICD-10-CM | POA: Diagnosis not present

## 2017-06-11 MED ORDER — ESTRADIOL 0.05 MG/24HR TD PTTW: 1.0000 | MEDICATED_PATCH | TRANSDERMAL | 3 refills | Status: DC

## 2017-06-11 NOTE — Patient Instructions (Signed)
PAP every three years Mammogram every year    Call [phone] to schedule at [facility] Colonoscopy every 10 years after age 45 Labs soon

## 2017-06-11 NOTE — Progress Notes (Signed)
HPI:      [name] is a 46 y.o. G1P0010 who LMP was No LMP recorded. Patient has had a hysterectomy., she presents today for her annual examination. The patient has no complaints today. The patient is sexually active. Her last pap: approximate date 4/18 and was normal and last mammogram: approximate date 2017 and was normal. The patient does perform self breast exams.  There is no notable family history of breast or ovarian cancer in her family.  The patient has regular exercise: yes.  The patient denies current symptoms of depression.    No further bleeding or Breast T after decreasing dose of ERT patch.  Desires to cont as helps w mood, hot flash, and OP prevention.  GYN History: Contraception: status post hysterectomy  PMHx: Past Medical History:  Diagnosis Date  . Abnormal Pap smear of cervix   . Breast mass   . Bulging of cervical intervertebral disc    sees chiropractor for alignment monthly  . Depression   . Endometriosis    s/p partial hysterectomy 2013  . Family history of adverse reaction to anesthesia    Father - PONV  . History of chicken pox   . Irregular menses   . Migraine headache    4-6x/yr  . Motion sickness    back seat of car  . PONV (postoperative nausea and vomiting)    severe  . Vertigo    3 episodes.  last one approx 2 months ago   Past Surgical History:  Procedure Laterality Date  . ABDOMINAL HYSTERECTOMY  11/2011    not due to cancer; Endometriosis and Ovarian cyst. Still has cervix  . APPENDECTOMY  08/13/2011   Diagnostic Laproscopy, prophylactic appendectomy, and drainage of right ovarian cyst done by Dr. Bary Castilla at [facility]  . BILATERAL SALPINGOOPHORECTOMY    . BREAST ENHANCEMENT SURGERY  2000   reduction - DR. MORRISON  . CERVIX LESION DESTRUCTION    . COLPOSCOPY  02/20/2003  . ENDOMETRIAL ABLATION  09/10/2006   BVD  . HYSTEROSCOPY  09/10/2006   D&C; WITH NOVASURE AND TUBAL LIGATION; BVD  . LAPAROSCOPIC SUPRACERVICAL  HYSTERECTOMY    . LARYNGOSCOPY  11/03/2006   DR MCQUEEN, SMALL CYSTIC AREA OF THE RIGHT ANTERIOR VOCAL FOLD.  Marland Kitchen LEEP  2004  . MM BREAST STEREO BIOPSY LEFT ([place] HX)  08/13/2009  . REDUCTION MAMMAPLASTY Bilateral 2000  . SHOULDER ARTHROSCOPY Right 05/06/2017   Procedure: Limited arthroscopic debridement, arthroscopic subacromial decompression, mini-open rotator cuff repair, and mini-open biceps tenodesis, right shoulder;  Surgeon: Corky Mull, MD;  Location: [facility];  Service: Orthopedics;  Laterality: Right;  . TUBAL LIGATION  2008   Laporoscopic tubal ligation by cautery  . TUBAL LIGATION  09/10/2006   BVD   Family History  Problem Relation Age of Onset  . Hypertension Mother   . Heart attack Maternal Grandfather   . Parkinson's disease Maternal Grandfather   . Cancer Maternal Grandfather        PROSTATE  . Heart attack Paternal Grandfather   . Lung cancer Paternal Grandfather   . Breast cancer Neg Hx    Social History   Tobacco Use  . Smoking status: Former Smoker    Packs/day: 0.50    Types: Cigarettes    Last attempt to quit: 07/15/2007    Years since quitting: 9.9  . Smokeless tobacco: Never Used  Substance Use Topics  . Alcohol use: Yes    Alcohol/week: 2.4 oz  Types: 4 Glasses of wine per week    Comment: occasional use  . Drug use: No    Current Outpatient Medications:  .  Cetirizine HCl (ZYRTEC ALLERGY) 10 MG CAPS, Take 1 capsule by mouth daily., Disp: , Rfl:  .  estradiol (VIVELLE-DOT) 0.05 MG/24HR patch, Place 1 patch (0.05 mg total) onto the skin 2 (two) times a week., Disp: 24 patch, Rfl: 3 .  gabapentin (NEURONTIN) 100 MG capsule, Take 200 mg by mouth 2 (two) times daily., Disp: , Rfl:  .  ibuprofen (ADVIL,MOTRIN) 200 MG tablet, Take 400 mg by mouth every 6 (six) hours as needed., Disp: , Rfl:  .  Melatonin 10 MG TABS, Take by mouth at bedtime as needed., Disp: , Rfl:  .  ondansetron (ZOFRAN) 4 MG tablet, Take 4 mg by mouth every 8 (eight)  hours as needed for nausea or vomiting., Disp: , Rfl:  .  oxyCODONE (ROXICODONE) 5 MG immediate release tablet, Take 1-2 tablets (5-10 mg total) by mouth every 4 (four) hours as needed for severe pain., Disp: 40 tablet, Rfl: 0 .  scopolamine (TRANSDERM-SCOP) 1 MG/3DAYS, Place 1 patch onto the skin every 3 (three) days., Disp: , Rfl:  .  venlafaxine XR (EFFEXOR-XR) 37.5 MG 24 hr capsule, TAKE 1 CAPSULE BY MOUTH EVERY DAY WITH BREAKFAST, Disp: 90 capsule, Rfl: 4 Allergies: Mushroom extract complex; Aspirin; Celecoxib; and Bupropion  Review of Systems  Constitutional: Negative for chills, fever and malaise/fatigue.  HENT: Negative for congestion, sinus pain and sore throat.   Eyes: Negative for blurred vision and pain.  Respiratory: Negative for cough and wheezing.   Cardiovascular: Negative for chest pain and leg swelling.  Gastrointestinal: Negative for abdominal pain, constipation, diarrhea, heartburn, nausea and vomiting.  Genitourinary: Negative for dysuria, frequency, hematuria and urgency.  Musculoskeletal: Negative for back pain, joint pain, myalgias and neck pain.  Skin: Negative for itching and rash.  Neurological: Negative for dizziness, tremors and weakness.  Endo/Heme/Allergies: Does not bruise/bleed easily.  Psychiatric/Behavioral: Negative for depression. The patient is not nervous/anxious and does not have insomnia.     Objective: BP 120/80   Pulse 81   Ht 5' 6"  (1.676 m)   Wt 117 lb (53.1 kg)   BMI 18.88 kg/m   Filed Weights   06/11/17 1020  Weight: 117 lb (53.1 kg)   Body mass index is 18.88 kg/m. Physical Exam  Constitutional: She is oriented to person, place, and time. She appears well-developed and well-nourished. No distress.  Genitourinary: Rectum normal and vagina normal. Pelvic exam was performed with patient supine. There is no rash or lesion on the right labia. There is no rash or lesion on the left labia. Vagina exhibits no lesion. No bleeding in the  vagina. Right adnexum does not display mass and does not display tenderness. Left adnexum does not display mass and does not display tenderness. Cervix does not exhibit motion tenderness, lesion, friability or polyp.  Genitourinary Comments: Absent uterus, no adnexal mass  HENT:  Head: Normocephalic and atraumatic. Head is without laceration.  Right Ear: Hearing normal.  Left Ear: Hearing normal.  Nose: No epistaxis.  No foreign bodies.  Mouth/Throat: Uvula is midline, oropharynx is clear and moist and mucous membranes are normal.  Eyes: Pupils are equal, round, and reactive to light.  Neck: Normal range of motion. Neck supple. No thyromegaly present.  Cardiovascular: Normal rate and regular rhythm. Exam reveals no gallop and no friction rub.  No murmur heard. Pulmonary/Chest: Effort normal and breath  sounds normal. No respiratory distress. She has no wheezes. Right breast exhibits no mass, no skin change and no tenderness. Left breast exhibits no mass, no skin change and no tenderness.  Abdominal: Soft. Bowel sounds are normal. She exhibits no distension. There is no tenderness. There is no rebound.  Musculoskeletal: Normal range of motion.  Neurological: She is alert and oriented to person, place, and time. No cranial nerve deficit.  Skin: Skin is warm and dry.  Psychiatric: She has a normal mood and affect. Judgment normal.  Vitals reviewed.   Assessment:  ANNUAL EXAM 1. Annual physical exam   2. Screening for breast cancer   3. Screening for cholesterol level   4. Screening for thyroid disorder   5. Screening for diabetes mellitus   6. Encounter for vitamin deficiency screening     Screening Plan:            1.  Cervical Screening-  Pap smear schedule reviewed with patient  2. Breast screening- Exam annually and mammogram>40 planned   3. Colonoscopy every 10 years, Hemoccult testing - after age 34  4. Labs To return fasting at a later date  Other:  1. Annual physical  exam  2. Screening for breast cancer - MM DIGITAL SCREENING BILATERAL; Future  3. Screening for cholesterol level - Lipid panel; Future  4. Screening for thyroid disorder - TSH; Future  5. Screening for diabetes mellitus - Hemoglobin A1c; Future  6. Encounter for vitamin deficiency screening - VITAMIN D 25 Hydroxy (Vit-D Deficiency, Fractures); Future  7. HRT- cont patch at current dose I have discussed HRT with the patient in detail.  The risk/benefits of it were reviewed.  She understands that during menopause Estrogen decreases dramatically and that this results in an increased risk of cardiovascular disease as well as osteoporosis.  We have also discussed the fact that hot flashes often result from a decrease in Estrogen, and that by replacing Estrogen, they can often be alleviated.  We have discussed skin, vaginal and urinary tract changes that may also take place from this drop in Estrogen.  Emotional changes have also been linked to Estrogen and we have briefly discussed this.  The benefits of HRT including decrease in hot flashes, vaginal dryness, and osteoporosis were discussed.  The emotional benefit and a possible change in her cardiovascular risk profile was also reviewed.  The risks associated with Hormone Replacement Therapy were also reviewed.  The use of unopposed Estrogen and its relationship to endometrial cancer was discussed.  The addition of Progesterone and its beneficial effect on endometrial cancer was also noted.  The fact that there has been no consistent definitive studies showing an increase in breast cancer in women who use HRT was discussed with the patient.  The possible side effects including breast tenderness, fluid retention, mood changes and vaginal bleeding were discussed.  The patient was informed that this is an elective medication and that she may choose not to take Hormone Replacement Therapy.  Literature on HRT was given, and I believe that after answering  all of the patient's questions, she has an adequate and informed understanding of HRT.  Special emphasis on the WHI study, as well as several studies since that pertaining to the risks and benefits of estrogen replacement therapy were compared.  The possible limitations of these studies were discussed including the age stratification of the WHI study.  The possible role of Progesterone in these studies was discussed in detail.  I believe that the  patient has an informed knowledge of the risks and benefits of HRT.  I have specifically discussed WHI findings and current updates.  Different type of hormone formulation and methods of taking hormone replacement therapy discussed.     F/U  Return in about 1 year (around 06/11/2018) for Annual.  Barnett Applebaum, MD, [facility], [facility] 06/11/2017  10:46 AM

## 2017-06-15 ENCOUNTER — Other Ambulatory Visit: Payer: BC Managed Care – PPO

## 2017-06-15 DIAGNOSIS — Z1321 Encounter for screening for nutritional disorder: Secondary | ICD-10-CM

## 2017-06-15 DIAGNOSIS — Z1329 Encounter for screening for other suspected endocrine disorder: Secondary | ICD-10-CM

## 2017-06-15 DIAGNOSIS — Z131 Encounter for screening for diabetes mellitus: Secondary | ICD-10-CM

## 2017-06-15 DIAGNOSIS — Z13220 Encounter for screening for lipoid disorders: Secondary | ICD-10-CM

## 2017-06-16 LAB — LIPID PANEL
CHOLESTEROL TOTAL: 191 mg/dL (ref 100–199)
Chol/HDL Ratio: 2.5 ratio (ref 0.0–4.4)
HDL: 77 mg/dL (ref >39)
LDL Calculated: 97 mg/dL (ref 0–99)
TRIGLYCERIDES: 85 mg/dL (ref 0–149)
VLDL CHOLESTEROL CAL: 17 mg/dL (ref 5–40)

## 2017-06-16 LAB — TSH: TSH: 2.06 u[IU]/mL (ref 0.450–4.500)

## 2017-06-16 LAB — VITAMIN D 25 HYDROXY (VIT D DEFICIENCY, FRACTURES): Vit D, 25-Hydroxy: 27 ng/mL — ABNORMAL LOW (ref 30.0–100.0)

## 2017-06-16 LAB — HEMOGLOBIN A1C
ESTIMATED AVERAGE GLUCOSE: 105 mg/dL
HEMOGLOBIN A1C: 5.3 % (ref 4.8–5.6)

## 2017-08-31 ENCOUNTER — Ambulatory Visit
Admission: RE | Admit: 2017-08-31 | Discharge: 2017-08-31 | Disposition: A | Discharge status: Home / Self Care | Payer: BC Managed Care – PPO | Source: Ambulatory Visit | Attending: Obstetrics & Gynecology | Admitting: Obstetrics & Gynecology

## 2017-08-31 DIAGNOSIS — Z1239 Encounter for other screening for malignant neoplasm of breast: Secondary | ICD-10-CM

## 2017-08-31 DIAGNOSIS — Z1231 Encounter for screening mammogram for malignant neoplasm of breast: Secondary | ICD-10-CM | POA: Diagnosis present

## 2017-10-28 ENCOUNTER — Telehealth: Payer: Self-pay

## 2017-10-28 NOTE — Telephone Encounter (Signed)
Pt is requesting a change in dosing of her estradiol patch.  She is currently on .73m patch twice a week, is having side effects and would like a lower dose.  [phone]

## 2017-10-29 ENCOUNTER — Other Ambulatory Visit: Payer: Self-pay | Admitting: Obstetrics & Gynecology

## 2017-10-29 ENCOUNTER — Encounter: Payer: Self-pay | Admitting: Obstetrics & Gynecology

## 2017-10-29 MED ORDER — ESTRADIOL 0.0375 MG/24HR TD PTTW
1.0000 | MEDICATED_PATCH | TRANSDERMAL | 11 refills | Status: DC
Start: 2017-10-29 — End: 2018-03-10

## 2017-10-29 NOTE — Telephone Encounter (Signed)
lvm to let pt know rx has been sent in, call if any questions

## 2017-10-29 NOTE — Telephone Encounter (Signed)
ERx changed to lower dose

## 2018-01-04 ENCOUNTER — Encounter: Payer: Self-pay | Admitting: Obstetrics & Gynecology

## 2018-01-04 DIAGNOSIS — N951 Menopausal and female climacteric states: Secondary | ICD-10-CM

## 2018-01-06 NOTE — Telephone Encounter (Signed)
Please advise 

## 2018-01-08 ENCOUNTER — Other Ambulatory Visit: Payer: BC Managed Care – PPO

## 2018-01-08 DIAGNOSIS — N951 Menopausal and female climacteric states: Secondary | ICD-10-CM

## 2018-01-09 LAB — ESTRADIOL: Estradiol: 26.7 pg/mL

## 2018-01-09 NOTE — Progress Notes (Signed)
Pls f/u with pt. Ordered since you were out of town. Pls see pt msg. Thx

## 2018-01-11 ENCOUNTER — Other Ambulatory Visit: Payer: Self-pay | Admitting: Obstetrics & Gynecology

## 2018-01-11 NOTE — Progress Notes (Signed)
Called, LM.  To discuss HRT upon return call.

## 2018-01-11 NOTE — Progress Notes (Signed)
Will increase HRT to .05 patch and see if Breast T or bleeding occurs, if so then will then back down dose and adjust Effexor/psych meds.  Otehrwise, she will call and we can renew Rx for .05 patch longer term.

## 2018-01-16 ENCOUNTER — Encounter: Payer: Self-pay | Admitting: Family Medicine

## 2018-01-18 ENCOUNTER — Encounter: Payer: Self-pay | Admitting: Family Medicine

## 2018-02-08 ENCOUNTER — Other Ambulatory Visit: Payer: Self-pay | Admitting: Family Medicine

## 2018-02-08 NOTE — Telephone Encounter (Signed)
[facility] faxed a refill request for the following medication. Thanks CC  venlafaxine XR (EFFEXOR-XR) 37.5 MG 24 hr capsule

## 2018-02-09 MED ORDER — VENLAFAXINE HCL ER 37.5 MG PO CP24: ORAL_CAPSULE | ORAL | 1 refills | Status: DC

## 2018-03-10 ENCOUNTER — Encounter: Payer: Self-pay | Admitting: Obstetrics & Gynecology

## 2018-03-10 ENCOUNTER — Other Ambulatory Visit: Payer: Self-pay | Admitting: Obstetrics & Gynecology

## 2018-03-10 MED ORDER — ESTRADIOL 0.05 MG/24HR TD PTTW
1.0000 | MEDICATED_PATCH | TRANSDERMAL | 12 refills | Status: DC
Start: 2018-03-11 — End: 2018-04-06

## 2018-03-31 ENCOUNTER — Encounter: Payer: Self-pay | Admitting: Obstetrics & Gynecology

## 2018-04-06 ENCOUNTER — Other Ambulatory Visit: Payer: Self-pay | Admitting: Obstetrics & Gynecology

## 2018-04-06 MED ORDER — ESTRADIOL 0.0375 MG/24HR TD PTTW: 1.0000 | MEDICATED_PATCH | TRANSDERMAL | 11 refills | Status: DC

## 2018-04-16 ENCOUNTER — Ambulatory Visit
Admission: RE | Admit: 2018-04-16 | Discharge: 2018-04-16 | Disposition: A | Discharge status: Home / Self Care | Payer: BC Managed Care – PPO | Source: Ambulatory Visit | Attending: Family Medicine | Admitting: Family Medicine

## 2018-04-16 ENCOUNTER — Encounter: Payer: Self-pay | Admitting: Family Medicine

## 2018-04-16 ENCOUNTER — Ambulatory Visit: Payer: BC Managed Care – PPO | Admitting: Family Medicine

## 2018-04-16 VITALS — BP 126/77 | HR 70 | Temp 98.3°F | Resp 16 | Wt 121.0 lb

## 2018-04-16 DIAGNOSIS — Z23 Encounter for immunization: Secondary | ICD-10-CM | POA: Diagnosis not present

## 2018-04-16 DIAGNOSIS — M25521 Pain in right elbow: Secondary | ICD-10-CM | POA: Diagnosis present

## 2018-04-16 NOTE — Patient Instructions (Addendum)
   Go to the [facility] on [facility] for right elbow Xray   Apply ice to elbow for 8-10 minutes three times a day every day.   If not much better within a week, you may need to follow up with Dr. Roland Rack

## 2018-04-16 NOTE — Addendum Note (Signed)
Addended by: Lelon Huh E on: 04/16/2018 05:19 PM   Modules accepted: Level of Service

## 2018-04-16 NOTE — Progress Notes (Signed)
Patient: Joanna Cervantes Female    DOB: 12/17/1970   47 y.o.   MRN: [id number] Visit Date: 04/16/2018  Today's Provider: Lelon Huh, MD   Chief Complaint  Patient presents with  . Elbow Pain   Subjective:    HPI Elbow pain: Patient comes in complaining of pain in her right elbow. She has also noticed a knot and bruising of the right elbow. Patient states these symptoms appeared 1 month ago and have progressively worsened. She had a steroid injection early this year for tennis elbow which worked well, but current sx are in the same spot as previous injection.     Allergies  Allergen Reactions  . Mushroom Extract Complex Swelling    throat  . Aspirin Hives  . Celecoxib Diarrhea  . Bupropion Rash and Swelling     Current Outpatient Medications:  .  estradiol (VIVELLE-DOT) 0.0375 MG/24HR, Place 1 patch onto the skin 2 (two) times a week., Disp: 8 patch, Rfl: 11 .  ibuprofen (ADVIL,MOTRIN) 200 MG tablet, Take 400 mg by mouth every 6 (six) hours as needed., Disp: , Rfl:  .  Melatonin 10 MG TABS, Take by mouth at bedtime as needed., Disp: , Rfl:  .  venlafaxine XR (EFFEXOR-XR) 37.5 MG 24 hr capsule, TAKE 1 CAPSULE BY MOUTH EVERY DAY WITH BREAKFAST, Disp: 90 capsule, Rfl: 1  Review of Systems  Constitutional: Negative for appetite change, chills, fatigue and fever.  Respiratory: Negative for chest tightness and shortness of breath.   Cardiovascular: Negative for chest pain and palpitations.  Gastrointestinal: Negative for abdominal pain, nausea and vomiting.  Musculoskeletal: Positive for arthralgias (right elbow pain) and joint swelling.  Neurological: Negative for dizziness and weakness.  Hematological: Bruises/bleeds easily.    Social History   Tobacco Use  . Smoking status: Former Smoker    Packs/day: 0.50    Types: Cigarettes    Last attempt to quit: 07/15/2007    Years since quitting: 10.7  . Smokeless tobacco: Never Used  Substance Use Topics  . Alcohol  use: Yes    Alcohol/week: 4.0 standard drinks    Types: 4 Glasses of wine per week    Comment: occasional use   Objective:   BP 126/77 (BP Location: Right Arm, Patient Position: Sitting, Cuff Size: Normal)   Pulse 70   Temp 98.3 F (36.8 C) (Oral)   Resp 16   Wt 121 lb (54.9 kg)   SpO2 99% Comment: room air  BMI 19.53 kg/m  Vitals:   04/16/18 1626  BP: 126/77  Pulse: 70  Resp: 16  Temp: 98.3 F (36.8 C)  TempSrc: Oral  SpO2: 99%  Weight: 121 lb (54.9 kg)     Physical Exam  General appearance: alert, well developed, well nourished, cooperative and in no distress Head: Normocephalic, without obvious abnormality, atraumatic Respiratory: Respirations even and unlabored, normal respiratory rate Extremities: Tender slightly violaceous discoloration over right lateral epicondyl. No erythema. No warm to touch. Slightly swollen.      Assessment & Plan:     1. 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 i'd rather not give her a steroid injection since she has already had one this year. May be a little more susceptible to bruising after previous steroid injection. Just recommend frequent icing for the time being. Get imaging to rule out other bony abnormalities. Will have her follow up with Dr. Roland Rack if not much better with icing regiment.  - DG Elbow Complete  Right; Future  2. Need for influenza vaccination  - Flu Vaccine QUAD 6+ mos PF IM (Fluarix Quad PF)       Lelon Huh, MD  [facility]

## 2018-04-19 ENCOUNTER — Telehealth: Payer: Self-pay

## 2018-04-19 NOTE — Telephone Encounter (Signed)
Pt advised.   Thanks,   -Lunetta Marina  

## 2018-04-19 NOTE — Telephone Encounter (Signed)
-----   Message from Birdie Sons, MD sent at 04/17/2018  9:04 PM EDT ----- Odette Horns of elbow is normal. Continue appyling ice three times a day, should be improving within a week. If not will need to follow up with Dr. Roland Rack.

## 2018-05-06 ENCOUNTER — Encounter: Payer: Self-pay | Admitting: Family Medicine

## 2018-05-07 MED ORDER — VENLAFAXINE HCL ER 75 MG PO CP24: 75.0000 mg | ORAL_CAPSULE | Freq: Every day | ORAL | 1 refills | Status: DC

## 2018-06-19 ENCOUNTER — Ambulatory Visit: Payer: BC Managed Care – PPO | Admitting: Family Medicine

## 2018-06-19 ENCOUNTER — Encounter: Payer: Self-pay | Admitting: Family Medicine

## 2018-06-19 VITALS — BP 121/77 | HR 64 | Temp 97.8°F | Resp 16 | Wt 122.0 lb

## 2018-06-19 DIAGNOSIS — L539 Erythematous condition, unspecified: Secondary | ICD-10-CM

## 2018-06-19 DIAGNOSIS — W57XXXA Bitten or stung by nonvenomous insect and other nonvenomous arthropods, initial encounter: Secondary | ICD-10-CM | POA: Diagnosis not present

## 2018-06-19 MED ORDER — CEPHALEXIN 500 MG PO CAPS: 500.0000 mg | ORAL_CAPSULE | Freq: Three times a day (TID) | ORAL | 0 refills | Status: DC

## 2018-06-19 NOTE — Progress Notes (Signed)
Established Patient Office Visit  Subjective:  Patient ID: Joanna Cervantes, female    DOB: 25-Jun-1971  Age: 47 y.o. MRN: [id number]  CC:  Chief Complaint  Patient presents with  . Rash    HPI Joanna Cervantes presents for Rash: Patient complains of rash involving the right arm. Rash started 2 days ago. Patient is concerned about a possible spider bite. Patient reports Joanna Cervantes stayed in a hotel Wednesday night and Thursday morning had rash.  Appearance of rash at onset: Color of lesion(s): pink. Rash has not changed over time Initial distribution: right arm.  Discomfort associated with rash: is pruritic.  Associated symptoms: none. Denies: abdominal pain and fever. Patient has not had previous evaluation of rash. Patient has not had previous treatment. Patient has not had contacts with similar rash. Patient has not identified precipitant. Patient has not had new exposures (soaps, lotions, laundry detergents, foods, medications, plants, insects or animals.)   Past Medical History:  Diagnosis Date  . Abnormal Pap smear of cervix   . Breast mass   . Bulging of cervical intervertebral disc    sees chiropractor for alignment monthly  . Depression   . Endometriosis    s/p partial hysterectomy 2013  . Family history of adverse reaction to anesthesia    Father - PONV  . History of chicken pox   . Irregular menses   . Migraine headache    4-6x/yr  . Motion sickness    back seat of car  . PONV (postoperative nausea and vomiting)    severe  . Vertigo    3 episodes.  last one approx 2 months ago    Past Surgical History:  Procedure Laterality Date  . ABDOMINAL HYSTERECTOMY  11/2011    not due to cancer; Endometriosis and Ovarian cyst. Still has cervix  . APPENDECTOMY  08/13/2011   Diagnostic Laproscopy, prophylactic appendectomy, and drainage of right ovarian cyst done by Dr. Bary Castilla at [facility]  . BILATERAL SALPINGOOPHORECTOMY    . BREAST ENHANCEMENT SURGERY  2000   reduction - DR.  MORRISON  . CERVIX LESION DESTRUCTION    . COLPOSCOPY  02/20/2003  . ENDOMETRIAL ABLATION  09/10/2006   BVD  . HYSTEROSCOPY  09/10/2006   D&C; WITH NOVASURE AND TUBAL LIGATION; BVD  . LAPAROSCOPIC SUPRACERVICAL HYSTERECTOMY    . LARYNGOSCOPY  11/03/2006   DR MCQUEEN, SMALL CYSTIC AREA OF THE RIGHT ANTERIOR VOCAL FOLD.  Marland Kitchen LEEP  2004  . MM BREAST STEREO BIOPSY LEFT ([place] HX)  08/13/2009  . REDUCTION MAMMAPLASTY Bilateral 2000  . SHOULDER ARTHROSCOPY Right 05/06/2017   Procedure: Limited arthroscopic debridement, arthroscopic subacromial decompression, mini-open rotator cuff repair, and mini-open biceps tenodesis, right shoulder;  Surgeon: Corky Mull, MD;  Location: [place];  Service: Orthopedics;  Laterality: Right;  . TUBAL LIGATION  2008   Laporoscopic tubal ligation by cautery  . TUBAL LIGATION  09/10/2006   BVD    Family History  Problem Relation Age of Onset  . Hypertension Mother   . Heart attack Maternal Grandfather   . Parkinson's disease Maternal Grandfather   . Cancer Maternal Grandfather        PROSTATE  . Heart attack Paternal Grandfather   . Lung cancer Paternal Grandfather   . Breast cancer Neg Hx     Social History   Socioeconomic History  . Marital status: Married    Spouse name: Not on file  . Number of children: 0  . Years of education: Not  on file  . Highest education level: Not on file  Occupational History  . Not on file  Social Needs  . Financial resource strain: Not on file  . Food insecurity:    Worry: Not on file    Inability: Not on file  . Transportation needs:    Medical: Not on file    Non-medical: Not on file  Tobacco Use  . Smoking status: Former Smoker    Packs/day: 0.50    Types: Cigarettes    Last attempt to quit: 07/15/2007    Years since quitting: 10.9  . Smokeless tobacco: Never Used  Substance and Sexual Activity  . Alcohol use: Yes    Alcohol/week: 4.0 standard drinks    Types: 4 Glasses of wine per week     Comment: occasional use  . Drug use: No  . Sexual activity: Yes    Birth control/protection: None  Lifestyle  . Physical activity:    Days per week: Not on file    Minutes per session: Not on file  . Stress: Not on file  Relationships  . Social connections:    Talks on phone: Not on file    Gets together: Not on file    Attends religious service: Not on file    Active member of club or organization: Not on file    Attends meetings of clubs or organizations: Not on file    Relationship status: Not on file  . Intimate partner violence:    Fear of current or ex partner: Not on file    Emotionally abused: Not on file    Physically abused: Not on file    Forced sexual activity: Not on file  Other Topics Concern  . Not on file  Social History Narrative  . Not on file    Outpatient Medications Prior to Visit  Medication Sig Dispense Refill  . estradiol (VIVELLE-DOT) 0.0375 MG/24HR Place 1 patch onto the skin 2 (two) times a week. 8 patch 11  . ibuprofen (ADVIL,MOTRIN) 200 MG tablet Take 400 mg by mouth every 6 (six) hours as needed.    . Melatonin 10 MG TABS Take by mouth at bedtime as needed.    . venlafaxine XR (EFFEXOR-XR) 75 MG 24 hr capsule Take 1 capsule (75 mg total) by mouth daily. 90 capsule 1   No facility-administered medications prior to visit.     Allergies  Allergen Reactions  . Mushroom Extract Complex Swelling    throat  . Aspirin Hives  . Celecoxib Diarrhea  . Bupropion Rash and Swelling    ROS Review of Systems  Constitutional: Negative for fever.  Cardiovascular: Negative.   Skin: Positive for rash.      Objective:    Physical Exam  Constitutional: Joanna Cervantes is oriented to person, place, and time. Joanna Cervantes appears well-developed and well-nourished. No distress.  HENT:  Head: Normocephalic and atraumatic.  Right Ear: Hearing normal.  Left Ear: Hearing normal.  Nose: Nose normal.  Eyes: Conjunctivae and lids are normal. Right eye exhibits no discharge.  Left eye exhibits no discharge. No scleral icterus.  Pulmonary/Chest: Effort normal. No respiratory distress.  Musculoskeletal: Normal range of motion.  Neurological: Joanna Cervantes is alert and oriented to person, place, and time.  Skin: Skin is intact. Rash noted. No lesion noted. There is erythema.  Three pruritic whelps and large 10-12 cm area of faint erythema remaining. One small raw area in the center without discharge.  Psychiatric: Joanna Cervantes has a normal mood and affect. Joanna Cervantes  speech is normal and behavior is normal. Thought content normal.    BP 121/77 (BP Location: Left Arm, Patient Position: Sitting, Cuff Size: Normal)   Pulse 64   Temp 97.8 F (36.6 C)   Resp 16   Wt 122 lb (55.3 kg)   SpO2 98%   BMI 19.69 kg/m  Wt Readings from Last 3 Encounters:  06/19/18 122 lb (55.3 kg)  04/16/18 121 lb (54.9 kg)  06/11/17 117 lb (53.1 kg)     Health Maintenance Due  Topic Date Due  . HIV Screening  05/30/1987    There are no preventive care reminders to display for this patient.  Lab Results  Component Value Date   TSH 2.060 06/15/2017   Lab Results  Component Value Date   WBC 3.7 11/06/2011   HGB 11.7 (L) 11/15/2011   HCT 38.8 11/06/2011   MCV 87 11/06/2011   PLT 219 11/06/2011   Lab Results  Component Value Date   NA 144 08/02/2011   K 3.7 08/02/2011   CO2 28 08/02/2011   GLUCOSE 90 08/02/2011   BUN 11 08/02/2011   CREATININE 0.80 08/02/2011   BILITOT 0.4 08/02/2011   ALKPHOS 30 (L) 08/02/2011   AST 21 08/02/2011   ALT 24 08/02/2011   PROT 6.9 08/02/2011   ALBUMIN 4.1 08/02/2011   CALCIUM 8.6 08/02/2011   ANIONGAP 12 08/02/2011   Lab Results  Component Value Date   CHOL 191 06/15/2017   Lab Results  Component Value Date   HDL 77 06/15/2017   Lab Results  Component Value Date   LDLCALC 97 06/15/2017   Lab Results  Component Value Date   TRIG 85 06/15/2017   Lab Results  Component Value Date   CHOLHDL 2.5 06/15/2017   Lab Results  Component Value Date     HGBA1C 5.3 06/15/2017      Assessment & Plan:   1. Bug bite with infection, initial encounter Onset 2 days ago the morning Joanna Cervantes woke up at a [employer] Rash on the right shoulder that was very bright red and tender with itching. Has tried Benadryl (children's) and redness has abated. No local lymphadenopathy or lymphangitis. Will treat with antibiotic and may apply Hydrocort (OTC) for itch and redness. Recheck if no better in 5 days. - cephALEXin (KEFLEX) 500 MG capsule; Take 1 capsule (500 mg total) by mouth 3 (three) times daily.  Dispense: 21 capsule; Refill: 0

## 2018-07-22 ENCOUNTER — Ambulatory Visit (INDEPENDENT_AMBULATORY_CARE_PROVIDER_SITE_OTHER): Payer: BC Managed Care – PPO | Admitting: Obstetrics & Gynecology

## 2018-07-22 ENCOUNTER — Encounter: Payer: Self-pay | Admitting: Obstetrics & Gynecology

## 2018-07-22 VITALS — BP 100/60 | Ht 66.0 in | Wt 123.0 lb

## 2018-07-22 DIAGNOSIS — Z1239 Encounter for other screening for malignant neoplasm of breast: Secondary | ICD-10-CM

## 2018-07-22 DIAGNOSIS — Z01419 Encounter for gynecological examination (general) (routine) without abnormal findings: Secondary | ICD-10-CM | POA: Diagnosis not present

## 2018-07-22 DIAGNOSIS — E559 Vitamin D deficiency, unspecified: Secondary | ICD-10-CM | POA: Insufficient documentation

## 2018-07-22 DIAGNOSIS — N951 Menopausal and female climacteric states: Secondary | ICD-10-CM

## 2018-07-22 MED ORDER — ESTRADIOL 0.0375 MG/24HR TD PTTW: 1.0000 | MEDICATED_PATCH | TRANSDERMAL | 4 refills | Status: DC

## 2018-07-22 NOTE — Patient Instructions (Signed)
PAP every three years Mammogram every year    Call [phone] to schedule at [facility] today- Vitamin D

## 2018-07-22 NOTE — Progress Notes (Signed)
HPI:      [name] is a 48 y.o. G1P0010 has had a [facility]) hysterectomy., she presents today for her annual examination. The patient has no complaints today.  Min hot flashes on this dose of ERT, no breast T or bleeding. The patient is sexually active. Her last pap: approximate date 2018 and was normal and last mammogram: approximate date 2018 and was normal. The patient does perform self breast exams.  There is no notable family history of breast or ovarian cancer in her family.  The patient has regular exercise: yes.  The patient denies current symptoms of depression.    GYN History: Contraception: status post hysterectomy  PMHx: Past Medical History:  Diagnosis Date  . Abnormal Pap smear of cervix   . Breast mass   . Bulging of cervical intervertebral disc    sees chiropractor for alignment monthly  . Depression   . Endometriosis    s/p partial hysterectomy 2013  . Family history of adverse reaction to anesthesia    Father - PONV  . History of chicken pox   . Irregular menses   . Migraine headache    4-6x/yr  . Motion sickness    back seat of car  . PONV (postoperative nausea and vomiting)    severe  . Vertigo    3 episodes.  last one approx 2 months ago   Past Surgical History:  Procedure Laterality Date  . ABDOMINAL HYSTERECTOMY  11/2011    not due to cancer; Endometriosis and Ovarian cyst. Still has cervix  . APPENDECTOMY  08/13/2011   Diagnostic Laproscopy, prophylactic appendectomy, and drainage of right ovarian cyst done by Dr. Bary Castilla at [facility]  . BILATERAL SALPINGOOPHORECTOMY    . BREAST ENHANCEMENT SURGERY  2000   reduction - DR. MORRISON  . CERVIX LESION DESTRUCTION    . COLPOSCOPY  02/20/2003  . ENDOMETRIAL ABLATION  09/10/2006   BVD  . HYSTEROSCOPY  09/10/2006   D&C; WITH NOVASURE AND TUBAL LIGATION; BVD  . LAPAROSCOPIC SUPRACERVICAL HYSTERECTOMY    . LARYNGOSCOPY  11/03/2006   DR MCQUEEN, SMALL CYSTIC AREA OF THE RIGHT ANTERIOR VOCAL FOLD.  Marland Kitchen  LEEP  2004  . MM BREAST STEREO BIOPSY LEFT ([place] HX)  08/13/2009  . REDUCTION MAMMAPLASTY Bilateral 2000  . SHOULDER ARTHROSCOPY Right 05/06/2017   Procedure: Limited arthroscopic debridement, arthroscopic subacromial decompression, mini-open rotator cuff repair, and mini-open biceps tenodesis, right shoulder;  Surgeon: Corky Mull, MD;  Location: [place];  Service: Orthopedics;  Laterality: Right;  . TUBAL LIGATION  2008   Laporoscopic tubal ligation by cautery  . TUBAL LIGATION  09/10/2006   BVD   Family History  Problem Relation Age of Onset  . Hypertension Mother   . Heart attack Maternal Grandfather   . Parkinson's disease Maternal Grandfather   . Cancer Maternal Grandfather        PROSTATE  . Heart attack Paternal Grandfather   . Lung cancer Paternal Grandfather   . Breast cancer Neg Hx    Social History   Tobacco Use  . Smoking status: Former Smoker    Packs/day: 0.50    Types: Cigarettes    Last attempt to quit: 07/15/2007    Years since quitting: 11.0  . Smokeless tobacco: Never Used  Substance Use Topics  . Alcohol use: Yes    Alcohol/week: 4.0 standard drinks    Types: 4 Glasses of wine per week    Comment: occasional use  . Drug  use: No    Current Outpatient Medications:  .  estradiol (VIVELLE-DOT) 0.0375 MG/24HR, Place 1 patch onto the skin 2 (two) times a week., Disp: 8 patch, Rfl: 11 .  ibuprofen (ADVIL,MOTRIN) 200 MG tablet, Take 400 mg by mouth every 6 (six) hours as needed., Disp: , Rfl:  .  Melatonin 10 MG TABS, Take by mouth at bedtime as needed., Disp: , Rfl:  .  venlafaxine XR (EFFEXOR-XR) 75 MG 24 hr capsule, Take 1 capsule (75 mg total) by mouth daily., Disp: 90 capsule, Rfl: 1 .  cephALEXin (KEFLEX) 500 MG capsule, Take 1 capsule (500 mg total) by mouth 3 (three) times daily. (Patient not taking: Reported on 07/22/2018), Disp: 21 capsule, Rfl: 0 Allergies: Mushroom extract complex; Aspirin; Celecoxib; and Bupropion  Review of Systems    Constitutional: Negative for chills, fever and malaise/fatigue.  HENT: Negative for congestion, sinus pain and sore throat.   Eyes: Negative for blurred vision and pain.  Respiratory: Negative for cough and wheezing.   Cardiovascular: Negative for chest pain and leg swelling.  Gastrointestinal: Negative for abdominal pain, constipation, diarrhea, heartburn, nausea and vomiting.  Genitourinary: Negative for dysuria, frequency, hematuria and urgency.  Musculoskeletal: Negative for back pain, joint pain, myalgias and neck pain.  Skin: Negative for itching and rash.  Neurological: Negative for dizziness, tremors and weakness.  Endo/Heme/Allergies: Does not bruise/bleed easily.  Psychiatric/Behavioral: Negative for depression. The patient is not nervous/anxious and does not have insomnia.    Objective: BP 100/60   Ht 5' 6"  (1.676 m)   Wt 123 lb (55.8 kg)   BMI 19.85 kg/m   Filed Weights   07/22/18 1428  Weight: 123 lb (55.8 kg)   Body mass index is 19.85 kg/m. Physical Exam Constitutional:      General: She is not in acute distress.    Appearance: She is well-developed.  Genitourinary:     Pelvic exam was performed with patient supine.     Vagina, cervix, right adnexa, left adnexa and rectum normal.     No lesions in the vagina.     No vaginal bleeding.     Uterus is absent.     No right or left adnexal mass present.     Right adnexa not tender.     Left adnexa not tender.  HENT:     Head: Normocephalic and atraumatic. No laceration.     Right Ear: Hearing normal.     Left Ear: Hearing normal.     Mouth/Throat:     Pharynx: Uvula midline.  Eyes:     Pupils: Pupils are equal, round, and reactive to light.  Neck:     Musculoskeletal: Normal range of motion and neck supple.     Thyroid: No thyromegaly.  Cardiovascular:     Rate and Rhythm: Normal rate and regular rhythm.     Heart sounds: No murmur. No friction rub. No gallop.   Pulmonary:     Effort: Pulmonary effort is  normal. No respiratory distress.     Breath sounds: Normal breath sounds. No wheezing.  Chest:     Breasts:        Right: No mass, skin change or tenderness.        Left: No mass, skin change or tenderness.  Abdominal:     General: Bowel sounds are normal. There is no distension.     Palpations: Abdomen is soft.     Tenderness: There is no abdominal tenderness. There is no rebound.  Musculoskeletal:  Normal range of motion.  Neurological:     Mental Status: She is alert and oriented to person, place, and time.     Cranial Nerves: No cranial nerve deficit.  Skin:    General: Skin is warm and dry.  Psychiatric:        Judgment: Judgment normal.  Vitals signs reviewed.   Assessment:  ANNUAL EXAM 1. Women's annual routine gynecological examination   2. Vasomotor symptoms due to menopause   3. Screening for breast cancer   4. Vitamin D deficiency disease    Screening Plan:            1.  Cervical Screening-  Pap smear done today, Pap smear schedule reviewed with patient  2. Breast screening- Exam annually and mammogram>40 planned   3. Colonoscopy every 10 years, Hemoccult testing - after age 62  4. Labs managed by PCP  5. Counseling for contraception: prior [facility]   6. HRT following hysterectomy for vasomotor sx's (ERT patch .0375 mg)    F/U  Return in about 1 year (around 07/23/2019) for Annual.  Barnett Applebaum, MD, [facility], [facility] 07/22/2018  2:32 PM

## 2018-07-23 LAB — VITAMIN D 25 HYDROXY (VIT D DEFICIENCY, FRACTURES): Vit D, 25-Hydroxy: 41.7 ng/mL (ref 30.0–100.0)

## 2018-08-06 ENCOUNTER — Other Ambulatory Visit: Payer: Self-pay | Admitting: Family Medicine

## 2018-09-07 ENCOUNTER — Ambulatory Visit
Admission: RE | Admit: 2018-09-07 | Discharge: 2018-09-07 | Disposition: A | Discharge status: Home / Self Care | Payer: BC Managed Care – PPO | Source: Ambulatory Visit | Attending: Obstetrics & Gynecology | Admitting: Obstetrics & Gynecology

## 2018-09-07 DIAGNOSIS — Z1231 Encounter for screening mammogram for malignant neoplasm of breast: Secondary | ICD-10-CM | POA: Insufficient documentation

## 2018-09-07 DIAGNOSIS — Z1239 Encounter for other screening for malignant neoplasm of breast: Secondary | ICD-10-CM

## 2019-02-21 ENCOUNTER — Other Ambulatory Visit: Payer: Self-pay

## 2019-02-21 ENCOUNTER — Ambulatory Visit: Payer: BC Managed Care – PPO | Admitting: Family Medicine

## 2019-02-21 ENCOUNTER — Encounter: Payer: Self-pay | Admitting: Family Medicine

## 2019-02-21 VITALS — BP 122/71 | HR 62 | Temp 97.9°F | Resp 15 | Wt 122.0 lb

## 2019-02-21 DIAGNOSIS — M7711 Lateral epicondylitis, right elbow: Secondary | ICD-10-CM | POA: Diagnosis not present

## 2019-02-21 MED ORDER — METHYLPREDNISOLONE ACETATE 40 MG/ML IJ SUSP: 40.0000 mg | Freq: Once | INTRAMUSCULAR | Status: DC

## 2019-02-21 NOTE — Patient Instructions (Addendum)
Lateral epicondylitis Lateral epicondylitis is swelling (inflammation) in your outer forearm, near your elbow. Swelling affects the tissues that connect muscle to bone (tendons). Tennis elbow can happen in any sport or job in which you use your elbow too much. It is caused by doing the same motion over and over. Tennis elbow can cause: Pain and tenderness in your forearm and the outer part of your elbow. You may have pain all the time, or only when using the arm. A burning feeling. This runs from your elbow through your arm. Weak grip in your hand. Follow these instructions at home: Activity Rest your elbow and wrist. Avoid activities that cause problems, as told by your doctor. If told by your doctor, wear an elbow strap to reduce stress on the area. Do physical therapy exercises as told. If you lift an object, lift it with your palm facing up. This is easier on your elbow. Lifestyle If your tennis elbow is caused by sports, check your equipment and make sure that: You are using it correctly. It fits you well. If your tennis elbow is caused by work or by using a computer, take breaks often to stretch your arm. Talk with your manager about how you can manage your condition at work. If you have a brace: Wear the brace as told by your doctor. Remove it only as told by your doctor. Loosen the brace if your fingers tingle, get numb, or turn cold and blue. Keep the brace clean. If the brace is not waterproof, ask your doctor if you may take the brace off for bathing. If you must keep the brace on while bathing: Do not let it get wet. Cover it with a watertight covering when you take a bath or a shower. General instructions  If told, put ice on the painful area: Put ice in a plastic bag. Place a towel between your skin and the bag. Leave the ice on for 20 minutes, 2-3 times a day. Take over-the-counter and prescription medicines only as told by your doctor. Keep all follow-up visits as told  by your doctor. This is important. Contact a doctor if: Your pain does not get better with treatment. Your pain gets worse. You have weakness in your forearm, hand, or fingers. You cannot feel your forearm, hand, or fingers. Summary Tennis elbow is swelling (inflammation) in your outer forearm, near your elbow. Tennis elbow is caused by doing the same motion over and over. Rest your elbow and wrist. Avoid activities that cause problems, as told by your doctor. If told, put ice on the painful area for 20 minutes, 2-3 times a day. This information is not intended to replace advice given to you by your health care provider. Make sure you discuss any questions you have with your health care provider. Document Released: 12/18/2009 Document Revised: 03/26/2018 Document Reviewed: 04/14/2017 Elsevier Patient Education  2020 [employer]. . Please review the attached list of medications and notify my office if there are any errors.   . Please bring all of your medications to every appointment so we can make sure that our medication list is the same as yours.   . We will have flu vaccines available after Labor Day. Please go to your pharmacy or call the office in early September to schedule you flu shot.

## 2019-02-21 NOTE — Progress Notes (Signed)
Patient: Joanna Cervantes Female    DOB: Apr 09, 1971   48 y.o.   MRN: [id number] Visit Date: 02/21/2019  Today's Provider: Lelon Huh, MD   Chief Complaint  Patient presents with  . Elbow Pain   Subjective:     Arm Pain  There was no injury mechanism. The pain is present in the right elbow. The quality of the pain is described as aching, burning and shooting. The pain radiates to the right arm and right hand. The pain has been constant since the incident. Associated symptoms include muscle weakness. Pertinent negatives include no chest pain, numbness or tingling. The symptoms are aggravated by movement and lifting. She has tried ice, immobilization and NSAIDs (chiropractor adjustment) for the symptoms. The treatment provided mild relief.  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 Roland Rack until she had injection of PRP in April which she states was very effective. However after working out in the yard about 6 weeks ago she starting having pain again in her left lateral elbow, extending into forearm making arm weak and unable to lift even a gallon of milk.   Allergies  Allergen Reactions  . Mushroom Extract Complex Swelling    throat  . Aspirin Hives  . Celecoxib Diarrhea  . Bupropion Rash and Swelling     Current Outpatient Medications:  .  estradiol (VIVELLE-DOT) 0.0375 MG/24HR, Place 1 patch onto the skin 2 (two) times a week., Disp: 24 patch, Rfl: 4 .  ibuprofen (ADVIL,MOTRIN) 200 MG tablet, Take 400 mg by mouth every 6 (six) hours as needed., Disp: , Rfl:  .  Melatonin 10 MG TABS, Take by mouth at bedtime as needed., Disp: , Rfl:  .  venlafaxine XR (EFFEXOR-XR) 75 MG 24 hr capsule, TAKE 1 CAPSULE EVERY DAY, Disp: 90 capsule, Rfl: 4  Review of Systems  Cardiovascular: Negative for chest pain.  Neurological: Negative for tingling and numbness.    Social History   Tobacco Use  . Smoking status: Former Smoker    Packs/day: 0.50     Types: Cigarettes    Quit date: 07/15/2007    Years since quitting: 11.6  . Smokeless tobacco: Never Used  Substance Use Topics  . Alcohol use: Yes    Alcohol/week: 4.0 standard drinks    Types: 4 Glasses of wine per week    Comment: occasional use      Objective:   BP 122/71   Pulse 62   Temp 97.9 F (36.6 C) (Oral)   Resp 15   Wt 122 lb (55.3 kg)   BMI 19.69 kg/m  Vitals:   02/21/19 1050  BP: 122/71  Pulse: 62  Resp: 15  Temp: 97.9 F (36.6 C)  TempSrc: Oral  Weight: 122 lb (55.3 kg)     Physical Exam   Tender left lateral epicondyle, no erythema. Or swelling.       Assessment & Plan    1. Lateral epicondylitis of right elbow Prepped with isopropyl alcohol Injected 1cc 1% lidocaine without epinephrine mixed with- methylPREDNISolone acetate (DEPO-MEDROL) injection 40 mg over lateral epicondyle Counseled on frequent use of ice and relative rest for 10-14 days. If sx recur will refer back to Dr. Roland Rack.     Lelon Huh, MD  [facility] Clint Bolder as a scribe for Lelon Huh, MD.,have documented all relevant documentation on the behalf of Lelon Huh, MD,as directed by  Lelon Huh, MD  while in the presence of Lelon Huh, MD.

## 2019-04-27 ENCOUNTER — Other Ambulatory Visit: Payer: Self-pay

## 2019-04-27 ENCOUNTER — Ambulatory Visit (INDEPENDENT_AMBULATORY_CARE_PROVIDER_SITE_OTHER): Payer: BC Managed Care – PPO

## 2019-04-27 DIAGNOSIS — Z23 Encounter for immunization: Secondary | ICD-10-CM

## 2019-08-11 ENCOUNTER — Encounter: Payer: Self-pay | Admitting: Physician Assistant

## 2019-08-11 ENCOUNTER — Ambulatory Visit (INDEPENDENT_AMBULATORY_CARE_PROVIDER_SITE_OTHER): Payer: BC Managed Care – PPO | Admitting: Physician Assistant

## 2019-08-11 ENCOUNTER — Ambulatory Visit: Payer: BC Managed Care – PPO | Attending: Internal Medicine

## 2019-08-11 VITALS — HR 81 | Temp 97.4°F

## 2019-08-11 DIAGNOSIS — R112 Nausea with vomiting, unspecified: Secondary | ICD-10-CM

## 2019-08-11 DIAGNOSIS — R5383 Other fatigue: Secondary | ICD-10-CM | POA: Diagnosis not present

## 2019-08-11 DIAGNOSIS — Z20822 Contact with and (suspected) exposure to covid-19: Secondary | ICD-10-CM

## 2019-08-11 MED ORDER — PROMETHAZINE HCL 12.5 MG PO TABS: 12.5000 mg | ORAL_TABLET | Freq: Three times a day (TID) | ORAL | 0 refills | Status: DC | PRN

## 2019-08-11 NOTE — Patient Instructions (Signed)
COVID-19 COVID-19 is a respiratory infection that is caused by a virus called severe acute respiratory syndrome coronavirus 2 (SARS-CoV-2). The disease is also known as coronavirus disease or novel coronavirus. In some people, the virus may not cause any symptoms. In others, it may cause a serious infection. The infection can get worse quickly and can lead to complications, such as:  Pneumonia, or infection of the lungs.  Acute respiratory distress syndrome or ARDS. This is a condition in which fluid build-up in the lungs prevents the lungs from filling with air and passing oxygen into the blood.  Acute respiratory failure. This is a condition in which there is not enough oxygen passing from the lungs to the body or when carbon dioxide is not passing from the lungs out of the body.  Sepsis or septic shock. This is a serious bodily reaction to an infection.  Blood clotting problems.  Secondary infections due to bacteria or fungus.  Organ failure. This is when your body's organs stop working. The virus that causes COVID-19 is contagious. This means that it can spread from person to person through droplets from coughs and sneezes (respiratory secretions). What are the causes? This illness is caused by a virus. You may catch the virus by:  Breathing in droplets from an infected person. Droplets can be spread by a person breathing, speaking, singing, coughing, or sneezing.  Touching something, like a table or a doorknob, that was exposed to the virus (contaminated) and then touching your mouth, nose, or eyes. What increases the risk? Risk for infection You are more likely to be infected with this virus if you:  Are within 6 feet (2 meters) of a person with COVID-19.  Provide care for or live with a person who is infected with COVID-19.  Spend time in crowded indoor spaces or live in shared housing. Risk for serious illness You are more likely to become seriously ill from the virus if  you:  Are 50 years of age or older. The higher your age, the more you are at risk for serious illness.  Live in a nursing home or long-term care facility.  Have cancer.  Have a long-term (chronic) disease such as: ? Chronic lung disease, including chronic obstructive pulmonary disease or asthma. ? A long-term disease that lowers your body's ability to fight infection (immunocompromised). ? Heart disease, including heart failure, a condition in which the arteries that lead to the heart become narrow or blocked (coronary artery disease), a disease which makes the heart muscle thick, weak, or stiff (cardiomyopathy). ? Diabetes. ? Chronic kidney disease. ? Sickle cell disease, a condition in which red blood cells have an abnormal "sickle" shape. ? Liver disease.  Are obese. What are the signs or symptoms? Symptoms of this condition can range from mild to severe. Symptoms may appear any time from 2 to 14 days after being exposed to the virus. They include:  A fever or chills.  A cough.  Difficulty breathing.  Headaches, body aches, or muscle aches.  Runny or stuffy (congested) nose.  A sore throat.  New loss of taste or smell. Some people may also have stomach problems, such as nausea, vomiting, or diarrhea. Other people may not have any symptoms of COVID-19. How is this diagnosed? This condition may be diagnosed based on:  Your signs and symptoms, especially if: ? You live in an area with a COVID-19 outbreak. ? You recently traveled to or from an area where the virus is common. ? You   provide care for or live with a person who was diagnosed with COVID-19. ? You were exposed to a person who was diagnosed with COVID-19.  A physical exam.  Lab tests, which may include: ? Taking a sample of fluid from the back of your nose and throat (nasopharyngeal fluid), your nose, or your throat using a swab. ? A sample of mucus from your lungs (sputum). ? Blood tests.  Imaging tests,  which may include, X-rays, CT scan, or ultrasound. How is this treated? At present, there is no medicine to treat COVID-19. Medicines that treat other diseases are being used on a trial basis to see if they are effective against COVID-19. Your health care provider will talk with you about ways to treat your symptoms. For most people, the infection is mild and can be managed at home with rest, fluids, and over-the-counter medicines. Treatment for a serious infection usually takes places in a hospital intensive care unit (ICU). It may include one or more of the following treatments. These treatments are given until your symptoms improve.  Receiving fluids and medicines through an IV.  Supplemental oxygen. Extra oxygen is given through a tube in the nose, a face mask, or a hood.  Positioning you to lie on your stomach (prone position). This makes it easier for oxygen to get into the lungs.  Continuous positive airway pressure (CPAP) or bi-level positive airway pressure (BPAP) machine. This treatment uses mild air pressure to keep the airways open. A tube that is connected to a motor delivers oxygen to the body.  Ventilator. This treatment moves air into and out of the lungs by using a tube that is placed in your windpipe.  Tracheostomy. This is a procedure to create a hole in the neck so that a breathing tube can be inserted.  Extracorporeal membrane oxygenation (ECMO). This procedure gives the lungs a chance to recover by taking over the functions of the heart and lungs. It supplies oxygen to the body and removes carbon dioxide. Follow these instructions at home: Lifestyle  If you are sick, stay home except to get medical care. Your health care provider will tell you how long to stay home. Call your health care provider before you go for medical care.  Rest at home as told by your health care provider.  Do not use any products that contain nicotine or tobacco, such as cigarettes,  e-cigarettes, and chewing tobacco. If you need help quitting, ask your health care provider.  Return to your normal activities as told by your health care provider. Ask your health care provider what activities are safe for you. General instructions  Take over-the-counter and prescription medicines only as told by your health care provider.  Drink enough fluid to keep your urine pale yellow.  Keep all follow-up visits as told by your health care provider. This is important. How is this prevented?  There is no vaccine to help prevent COVID-19 infection. However, there are steps you can take to protect yourself and others from this virus. To protect yourself:   Do not travel to areas where COVID-19 is a risk. The areas where COVID-19 is reported change often. To identify high-risk areas and travel restrictions, check the CDC travel website: wwwnc.cdc.gov/travel/notices  If you live in, or must travel to, an area where COVID-19 is a risk, take precautions to avoid infection. ? Stay away from people who are sick. ? Wash your hands often with soap and water for 20 seconds. If soap and water   are not available, use an alcohol-based hand sanitizer. ? Avoid touching your mouth, face, eyes, or nose. ? Avoid going out in public, follow guidance from your state and local health authorities. ? If you must go out in public, wear a cloth face covering or face mask. Make sure your mask covers your nose and mouth. ? Avoid crowded indoor spaces. Stay at least 6 feet (2 meters) away from others. ? Disinfect objects and surfaces that are frequently touched every day. This may include:  Counters and tables.  Doorknobs and light switches.  Sinks and faucets.  Electronics, such as phones, remote controls, keyboards, computers, and tablets. To protect others: If you have symptoms of COVID-19, take steps to prevent the virus from spreading to others.  If you think you have a COVID-19 infection, contact  your health care provider right away. Tell your health care team that you think you may have a COVID-19 infection.  Stay home. Leave your house only to seek medical care. Do not use public transport.  Do not travel while you are sick.  Wash your hands often with soap and water for 20 seconds. If soap and water are not available, use alcohol-based hand sanitizer.  Stay away from other members of your household. Let healthy household members care for children and pets, if possible. If you have to care for children or pets, wash your hands often and wear a mask. If possible, stay in your own room, separate from others. Use a different bathroom.  Make sure that all people in your household wash their hands well and often.  Cough or sneeze into a tissue or your sleeve or elbow. Do not cough or sneeze into your hand or into the air.  Wear a cloth face covering or face mask. Make sure your mask covers your nose and mouth. Where to find more information  Centers for Disease Control and Prevention: www.cdc.gov/coronavirus/2019-ncov/index.html  World Health Organization: www.who.int/health-topics/coronavirus Contact a health care provider if:  You live in or have traveled to an area where COVID-19 is a risk and you have symptoms of the infection.  You have had contact with someone who has COVID-19 and you have symptoms of the infection. Get help right away if:  You have trouble breathing.  You have pain or pressure in your chest.  You have confusion.  You have bluish lips and fingernails.  You have difficulty waking from sleep.  You have symptoms that get worse. These symptoms may represent a serious problem that is an emergency. Do not wait to see if the symptoms will go away. Get medical help right away. Call your local emergency services (911 in the [place] Do not drive yourself to the hospital. Let the emergency medical personnel know if you think you have  COVID-19. Summary  COVID-19 is a respiratory infection that is caused by a virus. It is also known as coronavirus disease or novel coronavirus. It can cause serious infections, such as pneumonia, acute respiratory distress syndrome, acute respiratory failure, or sepsis.  The virus that causes COVID-19 is contagious. This means that it can spread from person to person through droplets from breathing, speaking, singing, coughing, or sneezing.  You are more likely to develop a serious illness if you are 50 years of age or older, have a weak immune system, live in a nursing home, or have chronic disease.  There is no medicine to treat COVID-19. Your health care provider will talk with you about ways to treat your symptoms.    Take steps to protect yourself and others from infection. Wash your hands often and disinfect objects and surfaces that are frequently touched every day. Stay away from people who are sick and wear a mask if you are sick. This information is not intended to replace advice given to you by your health care provider. Make sure you discuss any questions you have with your health care provider. Document Revised: 04/29/2019 Document Reviewed: 08/05/2018 Elsevier Patient Education  [place].

## 2019-08-11 NOTE — Progress Notes (Signed)
Patient: Joanna Cervantes Female    DOB: 27-Apr-1971   49 y.o.   MRN: [id number] Visit Date: 08/11/2019  Today's Provider: Trinna Post, PA-C   Chief Complaint  Patient presents with  . Nausea   Subjective:    Virtual Visit via Video Note  I connected with Joanna Cervantes on 08/11/19 at  9:00 AM EST by a video enabled telemedicine application and verified that I am speaking with the correct person using two identifiers.  Location: Patient: Home Provider: office   I discussed the limitations of evaluation and management by telemedicine and the availability of in person appointments. The patient expressed understanding and agreed to proceed.     HPI Nausea: Patient complains of nausea for the past 5 days. Symptoms began last Friday. Patient reports episodes of vomiting 4 days ago, which has now resolved. She also complains of fatigue, chills, headaches, body aches, diarrhea and difficulty taking a deep breath. She states she had a negative COVID test on Monday 08/08/2019 done through [facility] through testing site. This was not a rapid test and it had to be sent off. She reports continued fatigue. She has been table to tolerate bland foods and denies blood in her stool. She has travelled to [place] on 08/01/2019.   Allergies  Allergen Reactions  . Mushroom Extract Complex Swelling    throat  . Aspirin Hives  . Celecoxib Diarrhea  . Bupropion Rash and Swelling     Current Outpatient Medications:  .  estradiol (VIVELLE-DOT) 0.0375 MG/24HR, Place 1 patch onto the skin 2 (two) times a week., Disp: 24 patch, Rfl: 4 .  ibuprofen (ADVIL,MOTRIN) 200 MG tablet, Take 400 mg by mouth every 6 (six) hours as needed., Disp: , Rfl:  .  Melatonin 10 MG TABS, Take by mouth at bedtime as needed., Disp: , Rfl:  .  venlafaxine XR (EFFEXOR-XR) 75 MG 24 hr capsule, TAKE 1 CAPSULE EVERY DAY, Disp: 90 capsule, Rfl: 4  Current Facility-Administered Medications:  .   methylPREDNISolone acetate (DEPO-MEDROL) injection 40 mg, 40 mg, Intra-Lesional, Once, Fisher, Kirstie Peri, MD  Review of Systems  Constitutional: Positive for fatigue. Negative for appetite change, chills and fever.  Respiratory: Negative for chest tightness and shortness of breath.        Trouble deep breathing  Cardiovascular: Negative for chest pain and palpitations.  Gastrointestinal: Positive for nausea. Negative for abdominal pain and vomiting.  Musculoskeletal: Positive for myalgias.  Neurological: Positive for headaches. Negative for dizziness and weakness.    Social History   Tobacco Use  . Smoking status: Former Smoker    Packs/day: 0.50    Types: Cigarettes    Quit date: 07/15/2007    Years since quitting: 12.0  . Smokeless tobacco: Never Used  Substance Use Topics  . Alcohol use: Yes    Alcohol/week: 4.0 standard drinks    Types: 4 Glasses of wine per week    Comment: occasional use      Objective:   Pulse 81   Temp (!) 97.4 F (36.3 C) (Temporal)  Vitals:   08/11/19 0841  Pulse: 81  Temp: (!) 97.4 F (36.3 C)  TempSrc: Temporal  There is no height or weight on file to calculate BMI.   Physical Exam Constitutional:      General: She is not in acute distress.    Appearance: Normal appearance. She is not ill-appearing.  Pulmonary:     Effort: Pulmonary effort is normal. No  respiratory distress.  Neurological:     Mental Status: She is alert. Mental status is at baseline.  Psychiatric:        Mood and Affect: Mood normal.        Behavior: Behavior normal.      No results found for any visits on 08/11/19.     Assessment & Plan    1. Other fatigue  Counseled on presumptive COVID diagnosis despite negative test. Counseled on isolation precautions and symptomatic treatment. Counseled on expected duration of illness.   2. Nausea and vomiting, intractability of vomiting not specified, unspecified vomiting type  - promethazine (PHENERGAN) 12.5 MG  tablet; Take 1 tablet (12.5 mg total) by mouth every 8 (eight) hours as needed for nausea or vomiting.  Dispense: 20 tablet; Refill: 0  The entirety of the information documented in the History of Present Illness, Review of Systems and Physical Exam were personally obtained by me. Portions of this information were initially documented by [facility] and reviewed by me for thoroughness and accuracy.       Trinna Post, PA-C  [facility]

## 2019-08-12 LAB — NOVEL CORONAVIRUS, NAA: SARS-CoV-2, NAA: NOT DETECTED

## 2019-08-29 ENCOUNTER — Other Ambulatory Visit: Payer: Self-pay | Admitting: Obstetrics & Gynecology

## 2019-08-29 ENCOUNTER — Other Ambulatory Visit: Payer: Self-pay | Admitting: Family Medicine

## 2019-08-29 NOTE — Telephone Encounter (Signed)
Requested medication (s) are due for refill today -yes  Requested medication (s) are on the active medication list -yes  Future visit scheduled -no  Last refill: 08/06/18 with 4 RF  Notes to clinic: Patient fails lab requirement for refill- message sent for review   Requested Prescriptions  Pending Prescriptions Disp Refills   venlafaxine XR (EFFEXOR-XR) 75 MG 24 hr capsule [Pharmacy Med Name: VENLAFAXINE HCL ER 75 MG CAP] 90 capsule 4    Sig: TAKE 1 CAPSULE BY MOUTH EVERY DAY      Psychiatry: Antidepressants - SNRI - desvenlafaxine & venlafaxine Failed - 08/29/2019  1:54 PM      Failed - LDL in normal range and within 360 days    LDL Calculated  Date Value Ref Range Status  06/15/2017 97 0 - 99 mg/dL Final          Failed - Total Cholesterol in normal range and within 360 days    Cholesterol, Total  Date Value Ref Range Status  06/15/2017 191 100 - 199 mg/dL Final          Failed - Triglycerides in normal range and within 360 days    Triglycerides  Date Value Ref Range Status  06/15/2017 85 0 - 149 mg/dL Final          Failed - Completed PHQ-2 or PHQ-9 in the last 360 days.      Passed - Last BP in normal range    BP Readings from Last 1 Encounters:  02/21/19 122/71          Passed - Valid encounter within last 6 months    Recent Outpatient Visits           2 weeks ago Other fatigue   [place], [place], [place]   6 months ago Lateral epicondylitis of right elbow   [facility] Birdie Sons, [place]   1 year ago Bug bite with infection, initial encounter   [place], Vickki Muff, [place]   1 year ago Right elbow pain   [facility] Birdie Sons, MD   2 years ago Episode of recurrent major depressive disorder, unspecified depression episode severity [facility])   [facility] Birdie Sons, MD                  Requested Prescriptions  Pending Prescriptions Disp  Refills   venlafaxine XR (EFFEXOR-XR) 75 MG 24 hr capsule [Pharmacy Med Name: VENLAFAXINE HCL ER 75 MG CAP] 90 capsule 4    Sig: TAKE 1 CAPSULE BY MOUTH EVERY DAY      Psychiatry: Antidepressants - SNRI - desvenlafaxine & venlafaxine Failed - 08/29/2019  1:54 PM      Failed - LDL in normal range and within 360 days    LDL Calculated  Date Value Ref Range Status  06/15/2017 97 0 - 99 mg/dL Final          Failed - Total Cholesterol in normal range and within 360 days    Cholesterol, Total  Date Value Ref Range Status  06/15/2017 191 100 - 199 mg/dL Final          Failed - Triglycerides in normal range and within 360 days    Triglycerides  Date Value Ref Range Status  06/15/2017 85 0 - 149 mg/dL Final          Failed - Completed PHQ-2 or PHQ-9 in the last 360 days.  Passed - Last BP in normal range    BP Readings from Last 1 Encounters:  02/21/19 122/71          Passed - Valid encounter within last 6 months    Recent Outpatient Visits           2 weeks ago Other fatigue   [place], [place], [place]   6 months ago Lateral epicondylitis of right elbow   [facility] Birdie Sons, MD   1 year ago Bug bite with infection, initial encounter   [place], Vickki Muff, [place]   1 year ago Right elbow pain   [facility] Birdie Sons, MD   2 years ago Episode of recurrent major depressive disorder, unspecified depression episode severity [facility])   [facility] Birdie Sons, MD

## 2019-09-16 ENCOUNTER — Other Ambulatory Visit: Payer: Self-pay

## 2019-09-16 ENCOUNTER — Other Ambulatory Visit (HOSPITAL_COMMUNITY)
Admission: RE | Admit: 2019-09-16 | Discharge: 2019-09-16 | Disposition: A | Discharge status: Home / Self Care | Payer: BC Managed Care – PPO | Source: Ambulatory Visit | Attending: Obstetrics & Gynecology | Admitting: Obstetrics & Gynecology

## 2019-09-16 ENCOUNTER — Ambulatory Visit (INDEPENDENT_AMBULATORY_CARE_PROVIDER_SITE_OTHER): Payer: BC Managed Care – PPO | Admitting: Obstetrics & Gynecology

## 2019-09-16 ENCOUNTER — Encounter: Payer: Self-pay | Admitting: Obstetrics & Gynecology

## 2019-09-16 VITALS — BP 120/80 | Ht 66.0 in | Wt 126.0 lb

## 2019-09-16 DIAGNOSIS — N951 Menopausal and female climacteric states: Secondary | ICD-10-CM

## 2019-09-16 DIAGNOSIS — Z01419 Encounter for gynecological examination (general) (routine) without abnormal findings: Secondary | ICD-10-CM | POA: Diagnosis not present

## 2019-09-16 DIAGNOSIS — Z90711 Acquired absence of uterus with remaining cervical stump: Secondary | ICD-10-CM | POA: Diagnosis not present

## 2019-09-16 DIAGNOSIS — Z124 Encounter for screening for malignant neoplasm of cervix: Secondary | ICD-10-CM | POA: Diagnosis present

## 2019-09-16 DIAGNOSIS — Z1231 Encounter for screening mammogram for malignant neoplasm of breast: Secondary | ICD-10-CM

## 2019-09-16 MED ORDER — ESTRADIOL 0.0375 MG/24HR TD PTTW: 1.0000 | MEDICATED_PATCH | TRANSDERMAL | 3 refills | Status: DC

## 2019-09-16 NOTE — Patient Instructions (Addendum)
[facility] Address: [address], [place], [place] [address] Phone: [phone]  Also, [facility] [place], [place]  - - - For hormone testing and providing of tailored hormone therapy   PAP every three years Mammogram every year    Call [phone] to schedule at [facility] yearly (with PCP)

## 2019-09-16 NOTE — Progress Notes (Signed)
HPI:      Ms. SHEMECA LUKASIK is a 49 y.o. G1P0010 who LMP was in the past, she presents today for her annual examination.  The patient has no complaints today. The patient is sexually active. Herlast pap: approximate date 2018 and was normal and last mammogram: approximate date 2020 and was normal.  The patient does perform self breast exams.  There is no notable family history of breast or ovarian cancer in her family. The patient is taking hormone replacement therapy - ERT patch, w sx's every 6 wks or so of breast T and mood chenges; has occas vag spotting.  The patient has regular exercise: yes. The patient denies current symptoms of depression.    PMHx: Past Medical History:  Diagnosis Date  . Abnormal Pap smear of cervix   . Breast mass   . Bulging of cervical intervertebral disc    sees chiropractor for alignment monthly  . Depression   . Endometriosis    s/p partial hysterectomy 2013  . Family history of adverse reaction to anesthesia    Father - PONV  . History of chicken pox   . Irregular menses   . Migraine headache    4-6x/yr  . Motion sickness    back seat of car  . PONV (postoperative nausea and vomiting)    severe  . Vertigo    3 episodes.  last one approx 2 months ago   Past Surgical History:  Procedure Laterality Date  . ABDOMINAL HYSTERECTOMY  11/2011    not due to cancer; Endometriosis and Ovarian cyst. Still has cervix  . APPENDECTOMY  08/13/2011   Diagnostic Laproscopy, prophylactic appendectomy, and drainage of right ovarian cyst done by Dr. Bary Castilla at [facility]  . BILATERAL SALPINGOOPHORECTOMY    . BREAST ENHANCEMENT SURGERY  2000   reduction - DR. MORRISON  . CERVIX LESION DESTRUCTION    . COLPOSCOPY  02/20/2003  . ENDOMETRIAL ABLATION  09/10/2006   BVD  . HYSTEROSCOPY  09/10/2006   D&C; WITH NOVASURE AND TUBAL LIGATION; BVD  . LAPAROSCOPIC SUPRACERVICAL HYSTERECTOMY    . LARYNGOSCOPY  11/03/2006   DR MCQUEEN, SMALL CYSTIC AREA OF THE RIGHT ANTERIOR  VOCAL FOLD.  Marland Kitchen LEEP  2004  . MM BREAST STEREO BIOPSY LEFT ([place] HX)  08/13/2009  . REDUCTION MAMMAPLASTY Bilateral 2000  . SHOULDER ARTHROSCOPY Right 05/06/2017   Procedure: Limited arthroscopic debridement, arthroscopic subacromial decompression, mini-open rotator cuff repair, and mini-open biceps tenodesis, right shoulder;  Surgeon: Corky Mull, MD;  Location: [facility];  Service: Orthopedics;  Laterality: Right;  . TUBAL LIGATION  2008   Laporoscopic tubal ligation by cautery  . TUBAL LIGATION  09/10/2006   BVD   Family History  Problem Relation Age of Onset  . Hypertension Mother   . Heart attack Maternal Grandfather   . Parkinson's disease Maternal Grandfather   . Cancer Maternal Grandfather        PROSTATE  . Heart attack Paternal Grandfather   . Lung cancer Paternal Grandfather   . Breast cancer Neg Hx    Social History   Tobacco Use  . Smoking status: Former Smoker    Packs/day: 0.50    Types: Cigarettes    Quit date: 07/15/2007    Years since quitting: 12.1  . Smokeless tobacco: Never Used  Substance Use Topics  . Alcohol use: Yes    Alcohol/week: 4.0 standard drinks    Types: 4 Glasses of wine per week    Comment: occasional  use  . Drug use: No    Current Outpatient Medications:  .  ibuprofen (ADVIL,MOTRIN) 200 MG tablet, Take 400 mg by mouth every 6 (six) hours as needed., Disp: , Rfl:  .  Melatonin 10 MG TABS, Take by mouth at bedtime as needed., Disp: , Rfl:  .  venlafaxine XR (EFFEXOR-XR) 75 MG 24 hr capsule, TAKE 1 CAPSULE BY MOUTH EVERY DAY, Disp: 90 capsule, Rfl: 4 .  [START ON 09/19/2019] estradiol (VIVELLE-DOT) 0.0375 MG/24HR, Place 1 patch onto the skin 2 (two) times a week., Disp: 24 patch, Rfl: 3  Current Facility-Administered Medications:  .  methylPREDNISolone acetate (DEPO-MEDROL) injection 40 mg, 40 mg, Intra-Lesional, Once, Fisher, Kirstie Peri, MD Allergies: Mushroom extract complex, Aspirin, Celecoxib, and Bupropion  Review of Systems    Constitutional: Positive for malaise/fatigue. Negative for chills and fever.  HENT: Negative for congestion, sinus pain and sore throat.   Eyes: Negative for blurred vision and pain.  Respiratory: Negative for cough and wheezing.   Cardiovascular: Negative for chest pain and leg swelling.  Gastrointestinal: Negative for abdominal pain, constipation, diarrhea, heartburn, nausea and vomiting.  Genitourinary: Negative for dysuria, frequency, hematuria and urgency.  Musculoskeletal: Negative for back pain, joint pain, myalgias and neck pain.  Skin: Negative for itching and rash.  Neurological: Positive for headaches. Negative for dizziness, tremors and weakness.  Endo/Heme/Allergies: Does not bruise/bleed easily.  Psychiatric/Behavioral: Positive for depression. The patient is nervous/anxious. The patient does not have insomnia.     Objective: BP 120/80   Ht 5' 6"  (1.676 m)   Wt 126 lb (57.2 kg)   BMI 20.34 kg/m   Filed Weights   09/16/19 1407  Weight: 126 lb (57.2 kg)   Body mass index is 20.34 kg/m. Physical Exam Constitutional:      General: She is not in acute distress.    Appearance: She is well-developed.  Genitourinary:     Pelvic exam was performed with patient supine.     Urethra, bladder, vagina and rectum normal.     No lesions in the vagina.     No vaginal bleeding.     No cervical motion tenderness, friability, lesion or polyp.     Uterus is absent.     No right or left adnexal mass present.     Right adnexa not tender.     Left adnexa not tender.  HENT:     Head: Normocephalic and atraumatic. No laceration.     Right Ear: Hearing normal.     Left Ear: Hearing normal.     Mouth/Throat:     Pharynx: Uvula midline.  Eyes:     Pupils: Pupils are equal, round, and reactive to light.  Neck:     Thyroid: No thyromegaly.  Cardiovascular:     Rate and Rhythm: Normal rate and regular rhythm.     Heart sounds: No murmur. No friction rub. No gallop.   Pulmonary:      Effort: Pulmonary effort is normal. No respiratory distress.     Breath sounds: Normal breath sounds. No wheezing.  Chest:     Breasts:        Right: No mass, skin change or tenderness.        Left: No mass, skin change or tenderness.  Abdominal:     General: Bowel sounds are normal. There is no distension.     Palpations: Abdomen is soft.     Tenderness: There is no abdominal tenderness. There is no rebound.  Musculoskeletal:  General: Normal range of motion.     Cervical back: Normal range of motion and neck supple.  Neurological:     Mental Status: She is alert and oriented to person, place, and time.     Cranial Nerves: No cranial nerve deficit.  Skin:    General: Skin is warm and dry.  Psychiatric:        Judgment: Judgment normal.  Vitals reviewed.     Assessment: Annual Exam 1. Women's annual routine gynecological examination   2. Encounter for screening mammogram for malignant neoplasm of breast   3. Screening for cervical cancer   4. Vasomotor symptoms due to menopause     Plan:            1.  Cervical Screening-  Pap smear done today  2. Breast screening- Exam annually and mammogram scheduled  3. Colonoscopy every 10 years, Hemoccult testing after age 49  4. Labs managed by PCP  5. Counseling for hormonal therapy: no change in therapy today; she does plan to investigate BHRT therapy as an alternative, since she does have some breakthru sx's of breast T and mood changes every few weeks on low dose ERT patch therapy.  Info provided. HRT I have discussed HRT with the patient in detail.  The risk/benefits of it were reviewed.  She understands that during menopause Estrogen decreases dramatically and that this results in an increased risk of cardiovascular disease as well as osteoporosis.  We have also discussed the fact that hot flashes often result from a decrease in Estrogen, and that by replacing Estrogen, they can often be alleviated.  We have discussed  skin, vaginal and urinary tract changes that may also take place from this drop in Estrogen.  Emotional changes have also been linked to Estrogen and we have briefly discussed this.  The benefits of HRT including decrease in hot flashes, vaginal dryness, and osteoporosis were discussed.  The emotional benefit and a possible change in her cardiovascular risk profile was also reviewed.  The risks associated with Hormone Replacement Therapy were also reviewed.  The use of unopposed Estrogen and its relationship to endometrial cancer was discussed.  The addition of Progesterone and its beneficial effect on endometrial cancer was also noted.  The fact that there has been no consistent definitive studies showing an increase in breast cancer in women who use HRT was discussed with the patient.  The possible side effects including breast tenderness, fluid retention, mood changes and vaginal bleeding were discussed.  The patient was informed that this is an elective medication and that she may choose not to take Hormone Replacement Therapy.  Literature on HRT was given, and I believe that after answering all of the patient's questions, she has an adequate and informed understanding of HRT.  Special emphasis on the WHI study, as well as several studies since that pertaining to the risks and benefits of estrogen replacement therapy were compared.  The possible limitations of these studies were discussed including the age stratification of the WHI study.  The possible role of Progesterone in these studies was discussed in detail.  I believe that the patient has an informed knowledge of the risks and benefits of HRT.  I have specifically discussed WHI findings and current updates.  Different type of hormone formulation and methods of taking hormone replacement therapy discussed.               6. FRAX - FRAX score for assessing the 10 year probability for  fracture calculated and discussed today.  Based on age and score today,  DEXA is not currently scheduled.    F/U  Return in about 1 year (around 09/15/2020) for Annual.  Barnett Applebaum, MD, [facility], [facility] 09/16/2019  2:49 PM

## 2019-09-21 LAB — CYTOLOGY - PAP
Comment: NEGATIVE
Diagnosis: NEGATIVE
High risk HPV: NEGATIVE

## 2019-10-05 ENCOUNTER — Ambulatory Visit
Admission: RE | Admit: 2019-10-05 | Discharge: 2019-10-05 | Disposition: A | Discharge status: Home / Self Care | Payer: BC Managed Care – PPO | Source: Ambulatory Visit | Attending: Obstetrics & Gynecology | Admitting: Obstetrics & Gynecology

## 2019-10-05 DIAGNOSIS — Z1231 Encounter for screening mammogram for malignant neoplasm of breast: Secondary | ICD-10-CM | POA: Diagnosis present

## 2019-10-11 ENCOUNTER — Other Ambulatory Visit: Payer: Self-pay | Admitting: Obstetrics & Gynecology

## 2019-10-11 NOTE — Progress Notes (Signed)
Bioidentical Hormone therapy discussed w [facility] (Dr Scherrie November), after pt received saliva testing Will add Progesterone and DHEA therapy to help alleviate sx's of menopause she is still having. They have provided the dosing recommendations at this compounding pharmacy.  Joanna Applebaum, MD, [facility], [facility] 10/11/2019  9:28 AM

## 2019-11-10 ENCOUNTER — Ambulatory Visit: Payer: BC Managed Care – PPO | Attending: Internal Medicine

## 2019-11-10 DIAGNOSIS — Z23 Encounter for immunization: Secondary | ICD-10-CM

## 2019-11-10 NOTE — Progress Notes (Signed)
   Covid-19 Vaccination Clinic  Name:  Joanna Cervantes    MRN: [id number] DOB: [birth year]  11/10/2019  Joanna Cervantes was observed post Covid-19 immunization for 15 minutes without incident. She was provided with Vaccine Information Sheet and instruction to access the V-Safe system.   Joanna Cervantes was instructed to call 911 with any severe reactions post vaccine: Marland Kitchen Difficulty breathing  . Swelling of face and throat  . A fast heartbeat  . A bad rash all over body  . Dizziness and weakness   Immunizations Administered    Name Date Dose VIS Date Route   Pfizer COVID-19 Vaccine 11/10/2019 10:00 AM 0.3 mL 09/07/2018 Intramuscular   Manufacturer: [employer], [employer]   Lot: [id number]   [place]: [id number]

## 2019-12-06 ENCOUNTER — Ambulatory Visit: Payer: BC Managed Care – PPO

## 2019-12-14 ENCOUNTER — Other Ambulatory Visit: Payer: Self-pay | Admitting: Physician Assistant

## 2019-12-14 ENCOUNTER — Encounter: Payer: Self-pay | Admitting: Physician Assistant

## 2019-12-14 ENCOUNTER — Other Ambulatory Visit: Payer: Self-pay

## 2019-12-14 ENCOUNTER — Ambulatory Visit: Payer: BC Managed Care – PPO | Admitting: Physician Assistant

## 2019-12-14 VITALS — BP 116/58 | HR 72 | Temp 96.6°F | Wt 125.6 lb

## 2019-12-14 DIAGNOSIS — R5383 Other fatigue: Secondary | ICD-10-CM | POA: Diagnosis not present

## 2019-12-14 NOTE — Progress Notes (Signed)
Established patient visit   Patient: Joanna Cervantes   DOB: [birth year]   49 y.o. Female  MRN: [id number] Visit Date: 12/14/2019  Today's healthcare provider: Trinna Post, PA-C   Chief Complaint  Patient presents with  . Headache  . Fatigue  I,Kyan Yurkovich M Hiilei Gerst,acting as a scribe for Trinna Post, PA-C.,have documented all relevant documentation on the behalf of Trinna Post, PA-C,as directed by  Trinna Post, PA-C while in the presence of Trinna Post, PA-C.  Subjective    Headache  This is a new problem. The current episode started 1 to 4 weeks ago. The problem occurs intermittently. The problem has been unchanged. The pain does not radiate. The quality of the pain is described as aching. The pain is at a severity of 5/10. The pain is mild. Associated symptoms include weakness. Pertinent negatives include no blurred vision, dizziness, drainage or numbness. The symptoms are aggravated by activity, bright light, fatigue and noise. She has tried darkened room, acetaminophen, NSAIDs and Excedrin for the symptoms. The treatment provided mild relief.    Fatigue Patient presents today for fatigue since 2 weeks ago and seems to have gotten worse. Patient states that the symptoms worsen after receiving first COVID vaccine in April,2021. Patient reports she had low iron when she was in college. Patient reports she has started sleeping better since started taking Progesterone and DHEA.     Medications: Outpatient Medications Prior to Visit  Medication Sig  . DHEA 10 MG CAPS Take 5 mg by mouth 2 (two) times daily.  Marland Kitchen estradiol (VIVELLE-DOT) 0.0375 MG/24HR Place 1 patch onto the skin 2 (two) times a week.  Marland Kitchen ibuprofen (ADVIL,MOTRIN) 200 MG tablet Take 400 mg by mouth every 6 (six) hours as needed.  . Melatonin 10 MG TABS Take by mouth at bedtime as needed.  Marland Kitchen PROGESTERONE PO Take by mouth.  . venlafaxine XR (EFFEXOR-XR) 75 MG 24 hr capsule TAKE 1 CAPSULE BY MOUTH  EVERY DAY   Facility-Administered Medications Prior to Visit  Medication Dose Route Frequency Provider  . methylPREDNISolone acetate (DEPO-MEDROL) injection 40 mg  40 mg Intra-Lesional Once Birdie Sons, MD    Review of Systems  Constitutional: Positive for fatigue.  Eyes: Negative for blurred vision.  Neurological: Positive for weakness and headaches. Negative for dizziness and numbness.      Objective    BP (!) 116/58 (BP Location: Left Arm, Patient Position: Sitting, Cuff Size: Normal)   Pulse 72   Temp (!) 96.6 F (35.9 C) (Temporal)   Wt 125 lb 9.6 oz (57 kg)   SpO2 97%   BMI 20.27 kg/m    Physical Exam Constitutional:      Appearance: Normal appearance. She is well-developed and normal weight.  Cardiovascular:     Rate and Rhythm: Normal rate and regular rhythm.     Pulses: Normal pulses.     Heart sounds: Normal heart sounds.  Pulmonary:     Effort: Pulmonary effort is normal.     Breath sounds: Normal breath sounds.  Skin:    General: Skin is warm and dry.  Neurological:     General: No focal deficit present.     Mental Status: She is alert and oriented to person, place, and time.  Psychiatric:        Mood and Affect: Mood normal.        Behavior: Behavior normal.       No results found for any visits  on 12/14/19.  Assessment & Plan    1. Fatigue, unspecified type Patient reports worsen fatigue since 2 weeks ago. Patient requested blood work and labs was ordered as below.  - CBC with Differential/Platelet - VITAMIN D 25 Hydroxy (Vit-D Deficiency, Fractures) - Comprehensive metabolic panel - TSH - Iron, TIBC and Ferritin Panel   Return if symptoms worsen or fail to improve.      ITrinna Post, PA-C, have reviewed all documentation for this visit. The documentation on 12/14/19 for the exam, diagnosis, procedures, and orders are all accurate and complete.    Paulene Floor  [facility] [phone]  (phone) [phone] (fax)  [place]

## 2019-12-14 NOTE — Patient Instructions (Signed)

## 2019-12-15 LAB — COMPREHENSIVE METABOLIC PANEL
ALT: 13 IU/L (ref 0–32)
AST: 22 IU/L (ref 0–40)
Albumin/Globulin Ratio: 2.9 — ABNORMAL HIGH (ref 1.2–2.2)
Albumin: 4.9 g/dL — ABNORMAL HIGH (ref 3.8–4.8)
Alkaline Phosphatase: 59 IU/L (ref 48–121)
BUN/Creatinine Ratio: 24 — ABNORMAL HIGH (ref 9–23)
BUN: 16 mg/dL (ref 6–24)
Bilirubin Total: 0.2 mg/dL (ref 0.0–1.2)
CO2: 26 mmol/L (ref 20–29)
Calcium: 9.6 mg/dL (ref 8.7–10.2)
Chloride: 100 mmol/L (ref 96–106)
Creatinine, Ser: 0.66 mg/dL (ref 0.57–1.00)
GFR calc Af Amer: 122 mL/min/{1.73_m2} (ref >59)
GFR calc non Af Amer: 106 mL/min/{1.73_m2} (ref >59)
Globulin, Total: 1.7 g/dL (ref 1.5–4.5)
Glucose: 92 mg/dL (ref 65–99)
Potassium: 4.3 mmol/L (ref 3.5–5.2)
Sodium: 139 mmol/L (ref 134–144)
Total Protein: 6.6 g/dL (ref 6.0–8.5)

## 2019-12-15 LAB — IRON,TIBC AND FERRITIN PANEL
Ferritin: 70 ng/mL (ref 15–150)
Iron Saturation: 31 % (ref 15–55)
Iron: 101 ug/dL (ref 27–159)
Total Iron Binding Capacity: 330 ug/dL (ref 250–450)
UIBC: 229 ug/dL (ref 131–425)

## 2019-12-15 LAB — CBC WITH DIFFERENTIAL/PLATELET
Basophils Absolute: 0 10*3/uL (ref 0.0–0.2)
Basos: 1 %
EOS (ABSOLUTE): 0 10*3/uL (ref 0.0–0.4)
Eos: 1 %
Hematocrit: 38.7 % (ref 34.0–46.6)
Hemoglobin: 13 g/dL (ref 11.1–15.9)
Immature Grans (Abs): 0 10*3/uL (ref 0.0–0.1)
Immature Granulocytes: 0 %
Lymphocytes Absolute: 1.7 10*3/uL (ref 0.7–3.1)
Lymphs: 42 %
MCH: 30 pg (ref 26.6–33.0)
MCHC: 33.6 g/dL (ref 31.5–35.7)
MCV: 89 fL (ref 79–97)
Monocytes Absolute: 0.2 10*3/uL (ref 0.1–0.9)
Monocytes: 5 %
Neutrophils Absolute: 2.1 10*3/uL (ref 1.4–7.0)
Neutrophils: 51 %
Platelets: 252 10*3/uL (ref 150–450)
RBC: 4.34 x10E6/uL (ref 3.77–5.28)
RDW: 13.3 % (ref 11.7–15.4)
WBC: 4 10*3/uL (ref 3.4–10.8)

## 2019-12-15 LAB — TSH: TSH: 1.02 u[IU]/mL (ref 0.450–4.500)

## 2019-12-15 LAB — VITAMIN D 25 HYDROXY (VIT D DEFICIENCY, FRACTURES): Vit D, 25-Hydroxy: 34.2 ng/mL (ref 30.0–100.0)

## 2019-12-20 ENCOUNTER — Ambulatory Visit: Payer: BC Managed Care – PPO

## 2020-01-09 ENCOUNTER — Other Ambulatory Visit: Payer: Self-pay

## 2020-01-09 ENCOUNTER — Encounter: Payer: Self-pay | Admitting: Physician Assistant

## 2020-01-09 ENCOUNTER — Ambulatory Visit (INDEPENDENT_AMBULATORY_CARE_PROVIDER_SITE_OTHER): Payer: BC Managed Care – PPO | Admitting: Physician Assistant

## 2020-01-09 VITALS — BP 112/75 | HR 76 | Temp 97.6°F | Resp 16 | Wt 123.0 lb

## 2020-01-09 DIAGNOSIS — H66001 Acute suppurative otitis media without spontaneous rupture of ear drum, right ear: Secondary | ICD-10-CM

## 2020-01-09 DIAGNOSIS — T3695XA Adverse effect of unspecified systemic antibiotic, initial encounter: Secondary | ICD-10-CM

## 2020-01-09 DIAGNOSIS — B379 Candidiasis, unspecified: Secondary | ICD-10-CM | POA: Diagnosis not present

## 2020-01-09 DIAGNOSIS — J0140 Acute pansinusitis, unspecified: Secondary | ICD-10-CM | POA: Diagnosis not present

## 2020-01-09 MED ORDER — AMOXICILLIN-POT CLAVULANATE 875-125 MG PO TABS: 1.0000 | ORAL_TABLET | Freq: Two times a day (BID) | ORAL | 0 refills | Status: DC

## 2020-01-09 MED ORDER — FLUCONAZOLE 150 MG PO TABS: 150.0000 mg | ORAL_TABLET | Freq: Once | ORAL | 0 refills | Status: AC

## 2020-01-09 NOTE — Progress Notes (Signed)
Established patient visit   Patient: Joanna Cervantes   DOB: [birth year]   49 y.o. Female  MRN: [id number] Visit Date: 01/09/2020  Today's healthcare provider: Mar Daring, PA-C   Chief Complaint  Patient presents with  . Otalgia   Subjective    Otalgia  There is pain in the right ear. This is a new problem. The current episode started in the past 7 days (Started last Wednesday). The problem occurs every few hours. The problem has been gradually improving. There has been no fever. The pain is mild. Associated symptoms include headaches ("sinus pressure'). Pertinent negatives include no coughing or ear discharge. She has tried acetaminophen and ear drops (OTC ear drops) for the symptoms.    Patient Active Problem List   Diagnosis Date Noted  . Vitamin D deficiency disease 07/22/2018  . Annual physical exam 06/11/2017  . Breast mass 10/28/2016  . Abnormal Pap smear of cervix 10/28/2016  . Allergic rhinitis 07/19/2015  . Oral aphthae 07/19/2015  . History of chicken pox 07/19/2015  . 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 Endometriosis 07/19/2015  . Abscess, pulp, finger 07/19/2015  . Migraine 07/19/2015  . History of tobacco use 09/11/2009   Past Medical History:  Diagnosis Date  . Abnormal Pap smear of cervix   . Bulging of cervical intervertebral disc    sees chiropractor for alignment monthly  . Depression   . Endometriosis    s/p partial hysterectomy 2013  . Family history of adverse reaction to anesthesia    Father - PONV  . History of chicken pox   . Irregular menses   . Migraine headache    4-6x/yr  . Motion sickness    back seat of car  . PONV (postoperative nausea and vomiting)    severe  . Vertigo    3 episodes.  last one approx 2 months ago       Medications: Outpatient Medications Prior to Visit  Medication Sig  . DHEA 10 MG CAPS Take 5 mg by mouth 2 (two) times daily.  Marland Kitchen estradiol (VIVELLE-DOT) 0.0375 MG/24HR Place 1 patch onto the skin 2  (two) times a week.  Marland Kitchen ibuprofen (ADVIL,MOTRIN) 200 MG tablet Take 400 mg by mouth every 6 (six) hours as needed.  . Melatonin 10 MG TABS Take by mouth at bedtime as needed.  Marland Kitchen PROGESTERONE PO Take by mouth.  . venlafaxine XR (EFFEXOR-XR) 75 MG 24 hr capsule TAKE 1 CAPSULE BY MOUTH EVERY DAY   Facility-Administered Medications Prior to Visit  Medication Dose Route Frequency Provider  . methylPREDNISolone acetate (DEPO-MEDROL) injection 40 mg  40 mg Intra-Lesional Once Birdie Sons, MD    Review of Systems  Constitutional: Negative for fever.  HENT: Positive for ear pain, postnasal drip and sinus pressure. Negative for congestion and ear discharge.   Respiratory: Negative for cough, chest tightness, shortness of breath and wheezing.   Cardiovascular: Negative for chest pain, palpitations and leg swelling.  Neurological: Positive for headaches ("sinus pressure').    Last CBC Lab Results  Component Value Date   WBC 4.0 12/14/2019   HGB 13.0 12/14/2019   HCT 38.7 12/14/2019   MCV 89 12/14/2019   MCH 30.0 12/14/2019   RDW 13.3 12/14/2019   PLT 252 03/70/4888   Last metabolic panel Lab Results  Component Value Date   GLUCOSE 92 12/14/2019   NA 139 12/14/2019   K 4.3 12/14/2019   CL 100 12/14/2019   CO2 26 12/14/2019  BUN 16 12/14/2019   CREATININE 0.66 12/14/2019   GFRNONAA 106 12/14/2019   GFRAA 122 12/14/2019   CALCIUM 9.6 12/14/2019   PROT 6.6 12/14/2019   ALBUMIN 4.9 (H) 12/14/2019   LABGLOB 1.7 12/14/2019   AGRATIO 2.9 (H) 12/14/2019   BILITOT 0.2 12/14/2019   ALKPHOS 59 12/14/2019   AST 22 12/14/2019   ALT 13 12/14/2019   ANIONGAP 12 08/02/2011      Objective    BP 112/75 (BP Location: Left Arm, Patient Position: Sitting, Cuff Size: Normal)   Pulse 76   Temp 97.6 F (36.4 C) (Temporal)   Resp 16   Wt 123 lb (55.8 kg)   BMI 19.85 kg/m  BP Readings from Last 3 Encounters:  01/09/20 112/75  12/14/19 (!) 116/58  09/16/19 120/80   Wt Readings from  Last 3 Encounters:  01/09/20 123 lb (55.8 kg)  12/14/19 125 lb 9.6 oz (57 kg)  09/16/19 126 lb (57.2 kg)      Physical Exam Vitals reviewed.  Constitutional:      General: She is not in acute distress.    Appearance: Normal appearance. She is well-developed, well-groomed and normal weight. She is not ill-appearing or diaphoretic.  HENT:     Head: Normocephalic and atraumatic.     Right Ear: Hearing, ear canal and external ear normal. A middle ear effusion (opaque) is present. Tympanic membrane is bulging. Tympanic membrane is not scarred, perforated or erythematous.     Left Ear: Hearing, tympanic membrane, ear canal and external ear normal.     Nose: Congestion present.     Right Turbinates: Not enlarged.     Left Turbinates: Not enlarged.     Right Sinus: Maxillary sinus tenderness and frontal sinus tenderness present.     Left Sinus: Maxillary sinus tenderness and frontal sinus tenderness present.     Mouth/Throat:     Pharynx: Uvula midline. Posterior oropharyngeal erythema (cobblestoning) present. No pharyngeal swelling or oropharyngeal exudate.  Neck:     Thyroid: No thyromegaly.     Trachea: No tracheal deviation.  Cardiovascular:     Rate and Rhythm: Normal rate and regular rhythm.     Heart sounds: Normal heart sounds. No murmur heard.  No friction rub. No gallop.   Pulmonary:     Effort: Pulmonary effort is normal. No respiratory distress.     Breath sounds: Normal breath sounds. No stridor. No wheezing or rales.  Musculoskeletal:     Cervical back: Normal range of motion and neck supple.  Lymphadenopathy:     Cervical: No cervical adenopathy.  Neurological:     Mental Status: She is alert.  Psychiatric:        Behavior: Behavior is cooperative.       No results found for any visits on 01/09/20.  Assessment & Plan     1. Acute non-recurrent pansinusitis Worsening symptoms that have not responded to OTC medications. Will give augmentin as below. Continue  allergy medications. Stay well hydrated and get plenty of rest. Call if no symptom improvement or if symptoms worsen. - amoxicillin-clavulanate (AUGMENTIN) 875-125 MG tablet; Take 1 tablet by mouth 2 (two) times daily.  Dispense: 20 tablet; Refill: 0  2. Antibiotic-induced yeast infection Gets yeast infections with antibiotics. Diflucan given as below.  - fluconazole (DIFLUCAN) 150 MG tablet; Take 1 tablet (150 mg total) by mouth once for 1 dose. May take 2nd pill in 48-72 hrs if needed  Dispense: 2 tablet; Refill: 0  3. Non-recurrent acute  suppurative otitis media of right ear without spontaneous rupture of tympanic membrane See above medical treatment plan for #1.   No follow-ups on file.      Reynolds Bowl, PA-C, have reviewed all documentation for this visit. The documentation on 01/10/20 for the exam, diagnosis, procedures, and orders are all accurate and complete.   Rubye Beach  [facility] [phone] (phone) [phone] (fax)  [place]

## 2020-01-10 ENCOUNTER — Encounter: Payer: Self-pay | Admitting: Physician Assistant

## 2020-01-24 ENCOUNTER — Ambulatory Visit: Payer: Self-pay

## 2020-01-24 ENCOUNTER — Ambulatory Visit
Admission: EM | Admit: 2020-01-24 | Discharge: 2020-01-24 | Disposition: A | Discharge status: Home / Self Care | Payer: BC Managed Care – PPO

## 2020-01-24 ENCOUNTER — Telehealth: Payer: Self-pay

## 2020-01-24 DIAGNOSIS — H9221 Otorrhagia, right ear: Secondary | ICD-10-CM | POA: Diagnosis not present

## 2020-01-24 DIAGNOSIS — H9201 Otalgia, right ear: Secondary | ICD-10-CM

## 2020-01-24 NOTE — Telephone Encounter (Signed)
Copied from [place] [phone]. Topic: General - Other >> Jan 24, 2020  8:43 AM Leward Quan A wrote: Reason for CRM: Patient called to inform Fenton Malling that she is still experiencing symptoms of the ear infection in her right ear. Also states that there is blood in the ear and she is concerned that she may need more antibiotics or a different Rx please advise can be reached at  Ph# [phone]

## 2020-01-24 NOTE — Telephone Encounter (Signed)
Please review triage message and advise of ok to schedule in office appointment.

## 2020-01-24 NOTE — ED Triage Notes (Signed)
Patient was seen and treated by her PCP for a right ear infection on 6/28 with 10 day course of augmentin. Reports continued pain in the right ear, itching in the right ear, and noticed dried blood in her ear cannel on Sunday. Noticed more blood today.  Denies: sore throat, cough, ShOB  OTC: ibuprofen, tylenol

## 2020-01-24 NOTE — Telephone Encounter (Signed)
OK to see in office for blood in ear if her sinus sx have resolved.

## 2020-01-24 NOTE — Discharge Instructions (Addendum)
I think that he may have had some hardened wax that scratched your ear canal.  Your eardrums look great today.  You can use a solution of half water, half peroxide and soak a cotton ball in it, then you may drop it into the right ear to help finish cleaning out the ear canal  Follow-up with this office or primary care as needed  Follow-up with the ER for trouble swallowing, trouble breathing, other concerning symptoms

## 2020-01-24 NOTE — Telephone Encounter (Signed)
Had appt on June 28 th for ear infection and sinus infection on Augmentin. Has improved but since finished Abx- noted blood in ear yesterday and continues to bleed can ear filling up again. C/o itching and sinus pressure. Care advice given. Informed pt not to submerge head in water ( no swimming). Pt verbalized understanding.  Unable to find appt for pt today or tomorrow. Called [facility] number and didn't get an answer. Routing note to office to review and make appt for pt. Please call pt. Reason for Disposition . Unexplained bleeding from ear  Answer Assessment - Initial Assessment Questions 1. LOCATION: "Which ear is involved?"      Right ear 2. COLOR: "What is the color of the discharge?"      Bright red blood 3. CONSISTENCY: "How runny is the discharge? Could it be water?"      Thin - not water 4. ONSET: "When did you first notice the discharge?"     Sunday am  5. PAIN: "Is there any earache?" "How bad is it?"  (Scale 1-10; or mild, moderate, severe)    Pressure no pain Feel full" 6. OBJECTS: "Have you put anything in your ear?" (e.g., Q-tip, other object)      Cotton ball and Qtip 7. OTHER SYMPTOMS: "Do you have any other symptoms?" (e.g., headache, fever, dizziness, vomiting, runny nose)     Runny nose and sinus pressure 8. PREGNANCY: "Is there any chance you are pregnant?" "When was your last menstrual period?"     n/a  Protocols used: EAR - DISCHARGE-A-AH

## 2020-01-24 NOTE — ED Provider Notes (Signed)
[place]   [id number] 01/24/20 Arrival Time: 4098  CC: EAR PAIN  SUBJECTIVE: History from: patient.   Joanna Cervantes is a 49 y.o. female who presents with of right ear pain for the last couple of days.  She also states that she has had some bleeding from the right ear.  Reports that she had an ear infection January 09, 2020 and that she was treated with 10 days of Augmentin.  She reports that her ear is not bothered her very much since she has finished her medication.  She was very concerned about seeing blood in her right ear though. Denies fever, chills, fatigue, sinus pain, rhinorrhea, ear discharge, sore throat, SOB, wheezing, chest pain, nausea, changes in bowel or bladder habits.    ROS: As per HPI.  All other pertinent ROS negative.     Past Medical History:  Diagnosis Date  . Abnormal Pap smear of cervix   . Bulging of cervical intervertebral disc    sees chiropractor for alignment monthly  . Depression   . Endometriosis    s/p partial hysterectomy 2013  . Family history of adverse reaction to anesthesia    Father - PONV  . History of chicken pox   . Irregular menses   . Migraine headache    4-6x/yr  . Motion sickness    back seat of car  . PONV (postoperative nausea and vomiting)    severe  . Vertigo    3 episodes.  last one approx 2 months ago   Past Surgical History:  Procedure Laterality Date  . ABDOMINAL HYSTERECTOMY  11/2011    not due to cancer; Endometriosis and Ovarian cyst. Still has cervix  . APPENDECTOMY  08/13/2011   Diagnostic Laproscopy, prophylactic appendectomy, and drainage of right ovarian cyst done by Dr. Bary Castilla at [facility]  . BILATERAL SALPINGOOPHORECTOMY    . BREAST ENHANCEMENT SURGERY  2000   reduction - DR. MORRISON  . CERVIX LESION DESTRUCTION    . COLPOSCOPY  02/20/2003  . ENDOMETRIAL ABLATION  09/10/2006   BVD  . HYSTEROSCOPY  09/10/2006   D&C; WITH NOVASURE AND TUBAL LIGATION; BVD  . LAPAROSCOPIC SUPRACERVICAL  HYSTERECTOMY    . LARYNGOSCOPY  11/03/2006   DR MCQUEEN, SMALL CYSTIC AREA OF THE RIGHT ANTERIOR VOCAL FOLD.  Marland Kitchen LEEP  2004  . MM BREAST STEREO BIOPSY LEFT ([place] HX)  08/13/2009  . REDUCTION MAMMAPLASTY Bilateral 2000  . SHOULDER ARTHROSCOPY Right 05/06/2017   Procedure: Limited arthroscopic debridement, arthroscopic subacromial decompression, mini-open rotator cuff repair, and mini-open biceps tenodesis, right shoulder;  Surgeon: Corky Mull, MD;  Location: [facility];  Service: Orthopedics;  Laterality: Right;  . TUBAL LIGATION  2008   Laporoscopic tubal ligation by cautery  . TUBAL LIGATION  09/10/2006   BVD   Allergies  Allergen Reactions  . Mushroom Extract Complex Swelling    throat  . Aspirin Hives  . Celecoxib Diarrhea  . Bupropion Rash and Swelling   Current Facility-Administered Medications on File Prior to Encounter  Medication Dose Route Frequency Provider Last Rate Last Admin  . methylPREDNISolone acetate (DEPO-MEDROL) injection 40 mg  40 mg Intra-Lesional Once Birdie Sons, MD       Current Outpatient Medications on File Prior to Encounter  Medication Sig Dispense Refill  . amoxicillin-clavulanate (AUGMENTIN) 875-125 MG tablet Take 1 tablet by mouth 2 (two) times daily. 20 tablet 0  . DHEA 10 MG CAPS Take 5 mg by mouth 2 (two) times daily.    Marland Kitchen  estradiol (VIVELLE-DOT) 0.0375 MG/24HR Place 1 patch onto the skin 2 (two) times a week. 24 patch 3  . ibuprofen (ADVIL,MOTRIN) 200 MG tablet Take 400 mg by mouth every 6 (six) hours as needed.    . Melatonin 10 MG TABS Take by mouth at bedtime as needed.    Marland Kitchen PROGESTERONE PO Take by mouth.    . venlafaxine XR (EFFEXOR-XR) 75 MG 24 hr capsule TAKE 1 CAPSULE BY MOUTH EVERY DAY 90 capsule 4   Social History   Socioeconomic History  . Marital status: Married    Spouse name: Not on file  . Number of children: 0  . Years of education: Not on file  . Highest education level: Not on file  Occupational History  .  Not on file  Tobacco Use  . Smoking status: Former Smoker    Packs/day: 0.50    Types: Cigarettes    Quit date: 07/15/2007    Years since quitting: 12.5  . Smokeless tobacco: Never Used  Vaping Use  . Vaping Use: Never used  Substance and Sexual Activity  . Alcohol use: Yes    Alcohol/week: 4.0 standard drinks    Types: 4 Glasses of wine per week    Comment: occasional use  . Drug use: No  . Sexual activity: Yes    Birth control/protection: None  Other Topics Concern  . Not on file  Social History Narrative  . Not on file   Social Determinants of Health   Financial Resource Strain:   . Difficulty of Paying Living Expenses:   Food Insecurity:   . Worried About Charity fundraiser in the Last Year:   . Arboriculturist in the Last Year:   Transportation Needs:   . Film/video editor (Medical):   Marland Kitchen Lack of Transportation (Non-Medical):   Physical Activity:   . Days of Exercise per Week:   . Minutes of Exercise per Session:   Stress:   . Feeling of Stress :   Social Connections:   . Frequency of Communication with Friends and Family:   . Frequency of Social Gatherings with Friends and Family:   . Attends Religious Services:   . Active Member of Clubs or Organizations:   . Attends Archivist Meetings:   Marland Kitchen Marital Status:   Intimate Partner Violence:   . Fear of Current or Ex-Partner:   . Emotionally Abused:   Marland Kitchen Physically Abused:   . Sexually Abused:    Family History  Problem Relation Age of Onset  . Hypertension Mother   . Heart attack Maternal Grandfather   . Parkinson's disease Maternal Grandfather   . Cancer Maternal Grandfather        PROSTATE  . Heart attack Paternal Grandfather   . Lung cancer Paternal Grandfather   . Breast cancer Neg Hx     OBJECTIVE:  Vitals:   01/24/20 1704  BP: 135/88  Pulse: 84  Resp: 12  Temp: 98.1 F (36.7 C)  SpO2: 98%     General appearance: alert; appears fatigued HEENT: Ears: L EAC clear, R EAC with  dried blood in ear canal, TMs pearly gray with visible cone of light, without erythema; Eyes: PERRL, EOMI grossly; Sinuses nontender to palpation; Nose: clear rhinorrhea; Throat: oropharynx not erythematous, tonsils  without white tonsillar exudates, uvula midline Neck: supple without LAD Lungs: unlabored respirations, symmetrical air entry; cough: absent; no respiratory distress Heart: regular rate and rhythm.  Radial pulses 2+ symmetrical bilaterally Skin: warm  and dry Psychological: alert and cooperative; normal mood and affect  Imaging: No results found.   ASSESSMENT & PLAN:  1. Acute otalgia, right   2. Bleeding from right ear     Presents with concern of having bleeding from the right ear Upon assessment, ear canal appears to have been scratched, no foreign body appreciated Discussed that she may use solution of half water, half peroxide with a cotton ball to drop into her ears to finish clearing out old blood that is there No acute trauma noted Continue to use OTC ibuprofen and/ or tylenol as needed for pain control Follow up with PCP if symptoms persists Return here or go to the ER if you have any new or worsening symptoms   Reviewed expectations re: course of current medical issues. Questions answered. Outlined signs and symptoms indicating need for more acute intervention. Patient verbalized understanding. After Visit Summary given.          Faustino Congress, NP 01/24/20 1722

## 2020-01-24 NOTE — Telephone Encounter (Signed)
I called patient to schedule an appointment. Our next available appointment is Thursday 01/26/2020 with Joanna Cervantes. Patient states she is going out of town to the [place] be able to come in on that day. Patient wants to be seen asap. Patient plans to call the [facility] urgent care in [place] to be seen tonight or in the morning.

## 2020-01-25 NOTE — Telephone Encounter (Signed)
I spoke with Joanna Cervantes.  She was told we did not have any appointments until 01/26/2020 so she was evaluated at Urgent Care  Thanks,   -Mickel Baas

## 2020-01-25 NOTE — Telephone Encounter (Signed)
If she is having blood in the ear she may need to be re-evaluated.

## 2020-01-26 NOTE — Telephone Encounter (Signed)
FYI

## 2020-02-13 ENCOUNTER — Ambulatory Visit: Payer: BC Managed Care – PPO | Attending: Internal Medicine

## 2020-02-13 DIAGNOSIS — Z23 Encounter for immunization: Secondary | ICD-10-CM

## 2020-02-13 NOTE — Progress Notes (Signed)
   Covid-19 Vaccination Clinic  Name:  Joanna Cervantes    MRN: [id number] DOB: 1971-06-01  02/13/2020  Ms. Apple was observed post Covid-19 immunization for 15 minutes without incident. She was provided with Vaccine Information Sheet and instruction to access the V-Safe system.   [name] was instructed to call 911 with any severe reactions post vaccine: Marland Kitchen Difficulty breathing  . Swelling of face and throat  . A fast heartbeat  . A bad rash all over body  . Dizziness and weakness   Immunizations Administered    Name Date Dose VIS Date Route   Pfizer COVID-19 Vaccine 02/13/2020  2:03 PM 0.3 mL 09/07/2018 Intramuscular   Manufacturer: [employer]   Lot: [id number]   [place]: [address]-8

## 2020-02-20 ENCOUNTER — Encounter: Payer: Self-pay | Admitting: Family Medicine

## 2020-02-20 DIAGNOSIS — J3489 Other specified disorders of nose and nasal sinuses: Secondary | ICD-10-CM

## 2020-02-20 DIAGNOSIS — R220 Localized swelling, mass and lump, head: Secondary | ICD-10-CM

## 2020-02-22 ENCOUNTER — Ambulatory Visit
Admission: RE | Admit: 2020-02-22 | Discharge: 2020-02-22 | Disposition: A | Discharge status: Home / Self Care | Payer: BC Managed Care – PPO | Attending: Family Medicine | Admitting: Family Medicine

## 2020-02-22 ENCOUNTER — Telehealth: Payer: Self-pay

## 2020-02-22 ENCOUNTER — Other Ambulatory Visit: Payer: Self-pay

## 2020-02-22 ENCOUNTER — Ambulatory Visit
Admission: RE | Admit: 2020-02-22 | Discharge: 2020-02-22 | Disposition: A | Discharge status: Home / Self Care | Payer: BC Managed Care – PPO | Source: Ambulatory Visit | Attending: Family Medicine | Admitting: Family Medicine

## 2020-02-22 DIAGNOSIS — J3489 Other specified disorders of nose and nasal sinuses: Secondary | ICD-10-CM | POA: Diagnosis present

## 2020-02-22 DIAGNOSIS — R220 Localized swelling, mass and lump, head: Secondary | ICD-10-CM

## 2020-02-22 DIAGNOSIS — S0992XA Unspecified injury of nose, initial encounter: Secondary | ICD-10-CM | POA: Diagnosis present

## 2020-02-22 NOTE — Telephone Encounter (Signed)
Copied from [place] [id number]. Topic: General - Call Back - No Documentation >> Feb 22, 2020  9:52 AM Jaynie Collins D wrote: Reason for CRM: Patient was calling to order X-rays for broken nose. Please call patient back with resolution.

## 2020-02-22 NOTE — Telephone Encounter (Signed)
Patient advised that order for x ray has been placed.

## 2020-06-27 ENCOUNTER — Encounter: Payer: Self-pay | Admitting: Physician Assistant

## 2020-06-27 ENCOUNTER — Ambulatory Visit: Payer: BC Managed Care – PPO | Admitting: Physician Assistant

## 2020-06-27 ENCOUNTER — Other Ambulatory Visit: Payer: Self-pay

## 2020-06-27 VITALS — BP 111/66 | HR 76 | Temp 98.3°F | Wt 128.4 lb

## 2020-06-27 DIAGNOSIS — H1031 Unspecified acute conjunctivitis, right eye: Secondary | ICD-10-CM

## 2020-06-27 MED ORDER — OFLOXACIN 0.3 % OP SOLN: 1.0000 [drp] | Freq: Four times a day (QID) | OPHTHALMIC | 0 refills | Status: AC

## 2020-06-27 NOTE — Progress Notes (Signed)
Established patient visit   Patient: Joanna Cervantes   DOB: 12/12/1970   49 y.o. Female  MRN: [id number] Visit Date: 06/27/2020  Today's healthcare provider: Trinna Post, PA-C   Chief Complaint  Patient presents with  . Eye Pain  I,Arch Methot M Raynelle Fujikawa,acting as a scribe for Trinna Post, PA-C.,have documented all relevant documentation on the behalf of Trinna Post, PA-C,as directed by  Trinna Post, PA-C while in the presence of Trinna Post, PA-C.  Subjective    Eye Pain  The right eye is affected. This is a new problem. The current episode started 1 to 4 weeks ago. The problem occurs constantly. The problem has been unchanged. There was no injury mechanism. The pain is at a severity of 0/10. The patient is experiencing no pain. Associated symptoms include eye redness and itching. Pertinent negatives include no blurred vision, eye discharge, double vision, fever or photophobia. She has tried eye drops for the symptoms. The treatment provided mild relief.    Reports 1.5 weeks ago she had a swollen eyelid on her right side and a red nodule on her upper lid. She had a patch of dryness as well. She used some cold compresses and vitamin e oil. No redness of the eye itself, no eye discharge.     Medications: Outpatient Medications Prior to Visit  Medication Sig  . DHEA 10 MG CAPS Take 5 mg by mouth 2 (two) times daily.  Marland Kitchen estradiol (VIVELLE-DOT) 0.0375 MG/24HR Place 1 patch onto the skin 2 (two) times a week.  Marland Kitchen ibuprofen (ADVIL,MOTRIN) 200 MG tablet Take 400 mg by mouth every 6 (six) hours as needed.  . Melatonin 10 MG TABS Take by mouth at bedtime as needed.  . venlafaxine XR (EFFEXOR-XR) 75 MG 24 hr capsule TAKE 1 CAPSULE BY MOUTH EVERY DAY  . amoxicillin-clavulanate (AUGMENTIN) 875-125 MG tablet Take 1 tablet by mouth 2 (two) times daily.  Marland Kitchen PROGESTERONE PO Take by mouth. (Patient not taking: Reported on 06/27/2020)   Facility-Administered Medications Prior  to Visit  Medication Dose Route Frequency Provider  . methylPREDNISolone acetate (DEPO-MEDROL) injection 40 mg  40 mg Intra-Lesional Once Birdie Sons, MD    Review of Systems  Constitutional: Negative for fever.  Eyes: Positive for pain, redness and itching. Negative for blurred vision, double vision, photophobia and discharge.  Cardiovascular: Negative.   Hematological: Negative.       Objective    BP 111/66 (BP Location: Left Arm, Patient Position: Sitting, Cuff Size: Large)   Pulse 76   Temp 98.3 F (36.8 C) (Oral)   Wt 128 lb 6.4 oz (58.2 kg)   SpO2 100%   BMI 20.72 kg/m    Physical Exam Constitutional:      Appearance: Normal appearance. She is normal weight.  Eyes:     General: Lids are normal. Lids are everted, no foreign bodies appreciated. Gaze aligned appropriately.     Conjunctiva/sclera:     Right eye: Right conjunctiva is not injected. No hemorrhage.    Left eye: Left conjunctiva is not injected. No hemorrhage. Skin:    General: Skin is warm and dry.  Neurological:     General: No focal deficit present.     Mental Status: She is alert and oriented to person, place, and time.  Psychiatric:        Mood and Affect: Mood normal.        Behavior: Behavior normal.  No results found for any visits on 06/27/20.  Assessment & Plan    1. Acute conjunctivitis of right eye, unspecified acute conjunctivitis type  Exam benign however patient travelling and will prescribe as below for any bacterial conjunctivitis.   - ofloxacin (OCUFLOX) 0.3 % ophthalmic solution; Place 1 drop into the right eye 4 (four) times daily for 7 days.  Dispense: 1.4 mL; Refill: 0   Return if symptoms worsen or fail to improve.      ITrinna Post, PA-C, have reviewed all documentation for this visit. The documentation on 06/27/20 for the exam, diagnosis, procedures, and orders are all accurate and complete.  The entirety of the information documented in the History of  Present Illness, Review of Systems and Physical Exam were personally obtained by me. Portions of this information were initially documented by [facility] and reviewed by me for thoroughness and accuracy.     Paulene Floor  [facility] [phone] (phone) [phone] (fax)  [place]

## 2020-06-27 NOTE — Patient Instructions (Signed)
Bacterial Conjunctivitis, Adult  Bacterial conjunctivitis is an infection of your conjunctiva. This is the clear membrane that covers the white part of your eye and the inner part of your eyelid. This infection can make your eye:  · Red or pink.  · Itchy.  This condition spreads easily from person to person (is contagious) and from one eye to the other eye.  What are the causes?  · This condition is caused by germs (bacteria). You may get the infection if you come into close contact with:  ? A person who has the infection.  ? Items that have germs on them (are contaminated), such as face towels, contact lens solution, or eye makeup.  What increases the risk?  You are more likely to get this condition if you:  · Have contact with people who have the infection.  · Wear contact lenses.  · Have a sinus infection.  · Have had a recent eye injury or surgery.  · Have a weak body defense system (immune system).  · Have dry eyes.  What are the signs or symptoms?    · Thick, yellowish discharge from the eye.  · Tearing or watery eyes.  · Itchy eyes.  · Burning feeling in your eyes.  · Eye redness.  · Swollen eyelids.  · Blurred vision.  How is this treated?    · Antibiotic eye drops or ointment.  · Antibiotic medicine taken by mouth. This is used for infections that do not get better with drops or ointment or that last more than 10 days.  · Cool, wet cloths placed on the eyes.  · Artificial tears used 2-6 times a day.  Follow these instructions at home:  Medicines  · Take or apply your antibiotic medicine as told by your doctor. Do not stop taking or applying the antibiotic even if you start to feel better.  · Take or apply over-the-counter and prescription medicines only as told by your doctor.  · Do not touch your eyelid with the eye-drop bottle or the ointment tube.  Managing discomfort  · Wipe any fluid from your eye with a warm, wet washcloth or a cotton ball.  · Place a clean, cool, wet cloth on your eye. Do this for  10-20 minutes, 3-4 times per day.  General instructions  · Do not wear contacts until the infection is gone. Wear glasses until your doctor says it is okay to wear contacts again.  · Do not wear eye makeup until the infection is gone. Throw away old eye makeup.  · Change or wash your pillowcase every day.  · Do not share towels or washcloths.  · Wash your hands often with soap and water. Use paper towels to dry your hands.  · Do not touch or rub your eyes.  · Do not drive or use heavy machinery if your vision is blurred.  Contact a doctor if:  · You have a fever.  · You do not get better after 10 days.  Get help right away if:  · You have a fever and your symptoms get worse all of a sudden.  · You have very bad pain when you move your eye.  · Your face:  ? Hurts.  ? Is red.  ? Is swollen.  · You have sudden loss of vision.  Summary  · Bacterial conjunctivitis is an infection of your conjunctiva.  · This infection spreads easily from person to person.  · Wash your hands often   questions you have with your health care provider. Document Revised: 10/19/2018 Document Reviewed: 02/03/2018 Elsevier Patient Education  [place].

## 2020-07-08 ENCOUNTER — Other Ambulatory Visit: Payer: Self-pay | Admitting: Obstetrics & Gynecology

## 2020-07-08 DIAGNOSIS — N951 Menopausal and female climacteric states: Secondary | ICD-10-CM

## 2020-07-09 NOTE — Telephone Encounter (Signed)
Patient is scheduled for 09/18/20 with [facility]

## 2020-08-27 ENCOUNTER — Other Ambulatory Visit: Payer: Self-pay | Admitting: Family Medicine

## 2020-09-18 ENCOUNTER — Ambulatory Visit (INDEPENDENT_AMBULATORY_CARE_PROVIDER_SITE_OTHER): Payer: BC Managed Care – PPO | Admitting: Obstetrics & Gynecology

## 2020-09-18 ENCOUNTER — Other Ambulatory Visit: Payer: Self-pay

## 2020-09-18 ENCOUNTER — Encounter: Payer: Self-pay | Admitting: Obstetrics & Gynecology

## 2020-09-18 VITALS — BP 100/60 | Ht 66.0 in | Wt 126.0 lb

## 2020-09-18 DIAGNOSIS — N951 Menopausal and female climacteric states: Secondary | ICD-10-CM

## 2020-09-18 DIAGNOSIS — Z01419 Encounter for gynecological examination (general) (routine) without abnormal findings: Secondary | ICD-10-CM | POA: Diagnosis not present

## 2020-09-18 DIAGNOSIS — Z1211 Encounter for screening for malignant neoplasm of colon: Secondary | ICD-10-CM | POA: Diagnosis not present

## 2020-09-18 DIAGNOSIS — Z1231 Encounter for screening mammogram for malignant neoplasm of breast: Secondary | ICD-10-CM | POA: Diagnosis not present

## 2020-09-18 MED ORDER — ESTRADIOL 0.0375 MG/24HR TD PTTW: MEDICATED_PATCH | TRANSDERMAL | 12 refills | Status: DC

## 2020-09-18 NOTE — Patient Instructions (Addendum)
PAP every three years Mammogram every year    Call [phone] to schedule at [facility] Colonoscopy every 10 years, or Cologuard every 3 years Labs yearly (with PCP)  Thank you for choosing [facility]. As part of our ongoing efforts to improve patient experience, we would appreciate your feedback. Please fill out the short survey that you will receive by mail or MyChart. Your opinion is important to [place]! -Dr Kenton Kingfisher  Recommendations to boost your immunity to prevent illness such as viral flu and colds, including covid19, are as follows:       - - -  Vitamin K2 and Vitamin D3  - - - Take Vitamin K2 at 200-300 mcg daily (usually 2-3 pills daily of the over the counter formulation). Take Vitamin D3 at 3000-4000 U daily (usually 3-4 pills daily of the over the counter formulation). Studies show that these two at high normal levels in your system are very effective in keeping your immunity so strong and protective that you will be unlikely to contract viral illness such as those listed above.  Dr Kenton Kingfisher

## 2020-09-18 NOTE — Progress Notes (Signed)
HPI:      [name] is a 50 y.o. G1P0010 who LMP was in the past (prior [facility]), she presents today for her annual examination.  The patient has no complaints today. The patient is sexually active. Herlast pap: approximate date 2021 and was normal and last mammogram: approximate date 2021 and was normal.  The patient does perform self breast exams.  There is no notable family history of breast or ovarian cancer in her family. The patient is taking hormone replacement therapy. Patient denies post-menopausal vaginal bleeding.   The patient has regular exercise: yes. The patient denies current symptoms of depression.    GYN Hx: Hormones for vasomotor sx's.    [place] [employer], newspaper for progesterone  PMHx: Past Medical History:  Diagnosis Date  . Abnormal Pap smear of cervix   . Bulging of cervical intervertebral disc    sees chiropractor for alignment monthly  . Depression   . Endometriosis    s/p partial hysterectomy 2013  . Family history of adverse reaction to anesthesia    Father - PONV  . History of chicken pox   . Irregular menses   . Migraine headache    4-6x/yr  . Motion sickness    back seat of car  . PONV (postoperative nausea and vomiting)    severe  . Vertigo    3 episodes.  last one approx 2 months ago   Past Surgical History:  Procedure Laterality Date  . ABDOMINAL HYSTERECTOMY  11/2011    not due to cancer; Endometriosis and Ovarian cyst. Still has cervix  . APPENDECTOMY  08/13/2011   Diagnostic Laproscopy, prophylactic appendectomy, and drainage of right ovarian cyst done by Dr. Bary Castilla at [facility]  . BILATERAL SALPINGOOPHORECTOMY    . BREAST ENHANCEMENT SURGERY  2000   reduction - DR. MORRISON  . CERVIX LESION DESTRUCTION    . COLPOSCOPY  02/20/2003  . ENDOMETRIAL ABLATION  09/10/2006   BVD  . HYSTEROSCOPY  09/10/2006   D&C; WITH NOVASURE AND TUBAL LIGATION; BVD  . LAPAROSCOPIC SUPRACERVICAL HYSTERECTOMY    . LARYNGOSCOPY  11/03/2006   DR  MCQUEEN, SMALL CYSTIC AREA OF THE RIGHT ANTERIOR VOCAL FOLD.  Marland Kitchen LEEP  2004  . MM BREAST STEREO BIOPSY LEFT ([place] HX)  08/13/2009  . REDUCTION MAMMAPLASTY Bilateral 2000  . SHOULDER ARTHROSCOPY Right 05/06/2017   Procedure: Limited arthroscopic debridement, arthroscopic subacromial decompression, mini-open rotator cuff repair, and mini-open biceps tenodesis, right shoulder;  Surgeon: Corky Mull, MD;  Location: [facility];  Service: Orthopedics;  Laterality: Right;  . TUBAL LIGATION  2008   Laporoscopic tubal ligation by cautery  . TUBAL LIGATION  09/10/2006   BVD   Family History  Problem Relation Age of Onset  . Hypertension Mother   . Heart attack Maternal Grandfather   . Parkinson's disease Maternal Grandfather   . Cancer Maternal Grandfather        PROSTATE  . Heart attack Paternal Grandfather   . Lung cancer Paternal Grandfather   . Breast cancer Neg Hx    Social History   Tobacco Use  . Smoking status: Former Smoker    Packs/day: 0.50    Types: Cigarettes    Quit date: 07/15/2007    Years since quitting: 13.1  . Smokeless tobacco: Never Used  Vaping Use  . Vaping Use: Never used  Substance Use Topics  . Alcohol use: Yes    Alcohol/week: 4.0 standard drinks    Types: 4 Glasses of wine  per week    Comment: occasional use  . Drug use: No    Current Outpatient Medications:  .  DHEA 10 MG CAPS, Take 5 mg by mouth 2 (two) times daily., Disp: , Rfl:  .  DOTTI 0.0375 MG/24HR, PLACE 1 PATCH ONTO SKIN 2 TIMES A WEEK, Disp: 8 patch, Rfl: 3 .  ibuprofen (ADVIL,MOTRIN) 200 MG tablet, Take 400 mg by mouth every 6 (six) hours as needed., Disp: , Rfl:  .  Melatonin 10 MG TABS, Take by mouth at bedtime as needed., Disp: , Rfl:  .  PROGESTERONE PO, Take by mouth., Disp: , Rfl:  .  venlafaxine XR (EFFEXOR-XR) 75 MG 24 hr capsule, TAKE 1 CAPSULE BY MOUTH EVERY DAY, Disp: 90 capsule, Rfl: 0 .  amoxicillin-clavulanate (AUGMENTIN) 875-125 MG tablet, Take 1 tablet by mouth 2  (two) times daily. (Patient not taking: Reported on 09/18/2020), Disp: 20 tablet, Rfl: 0  Current Facility-Administered Medications:  .  methylPREDNISolone acetate (DEPO-MEDROL) injection 40 mg, 40 mg, Intra-Lesional, Once, Fisher, Kirstie Peri, MD Allergies: Mushroom extract complex, Aspirin, Celecoxib, and Bupropion  Review of Systems  Constitutional: Negative for chills, fever and malaise/fatigue.  HENT: Negative for congestion, sinus pain and sore throat.   Eyes: Negative for blurred vision and pain.  Respiratory: Negative for cough and wheezing.   Cardiovascular: Negative for chest pain and leg swelling.  Gastrointestinal: Negative for abdominal pain, constipation, diarrhea, heartburn, nausea and vomiting.  Genitourinary: Negative for dysuria, frequency, hematuria and urgency.  Musculoskeletal: Negative for back pain, joint pain, myalgias and neck pain.  Skin: Negative for itching and rash.  Neurological: Negative for dizziness, tremors and weakness.  Endo/Heme/Allergies: Does not bruise/bleed easily.  Psychiatric/Behavioral: Negative for depression. The patient is not nervous/anxious and does not have insomnia.     Objective: BP 100/60   Ht 5' 6"  (1.676 m)   Wt 126 lb (57.2 kg)   BMI 20.34 kg/m   Filed Weights   09/18/20 0857  Weight: 126 lb (57.2 kg)   Body mass index is 20.34 kg/m. Physical Exam Constitutional:      General: She is not in acute distress.    Appearance: She is well-developed.  Genitourinary:     Vulva, urethra, bladder, vagina, rectum and urethral meatus normal.     No lesions in the vagina.     Genitourinary Comments: Vaginal cuff well healed     No vaginal bleeding.      Right Adnexa: not tender and no mass present.    Left Adnexa: not tender and no mass present.    No cervical motion tenderness or lesion.     Uterus is absent.     Pelvic exam was performed with patient in the lithotomy position.  Breasts:     Right: No mass, skin change or  tenderness.     Left: No mass, skin change or tenderness.    HENT:     Head: Normocephalic and atraumatic. No laceration.     Right Ear: Hearing normal.     Left Ear: Hearing normal.     Mouth/Throat:     Pharynx: Uvula midline.  Eyes:     Pupils: Pupils are equal, round, and reactive to light.  Neck:     Thyroid: No thyromegaly.  Cardiovascular:     Rate and Rhythm: Normal rate and regular rhythm.     Heart sounds: No murmur heard. No friction rub. No gallop.   Pulmonary:     Effort: Pulmonary effort is normal.  No respiratory distress.     Breath sounds: Normal breath sounds. No wheezing.  Abdominal:     General: Bowel sounds are normal. There is no distension.     Palpations: Abdomen is soft.     Tenderness: There is no abdominal tenderness. There is no rebound.  Musculoskeletal:        General: Normal range of motion.     Cervical back: Normal range of motion and neck supple.  Neurological:     Mental Status: She is alert and oriented to person, place, and time.     Cranial Nerves: No cranial nerve deficit.  Skin:    General: Skin is warm and dry.  Psychiatric:        Judgment: Judgment normal.  Vitals reviewed.     Assessment: Annual Exam 1. Women's annual routine gynecological examination   2. Vasomotor symptoms due to menopause   3. Encounter for screening mammogram for malignant neoplasm of breast     Plan:            1.  Cervical Screening (prior [facility])-  Pap smear done today, Pap smear schedule reviewed with patient, due 2024  2. Breast screening- Exam annually and mammogram scheduled  3. Colonoscopy every 10 years, Hemoccult testing after age 60  4. Labs managed by PCP  5. Counseling for hormonal therapy: no change in therapy today              6. FRAX - FRAX score for assessing the 10 year probability for fracture calculated and discussed today.  Based on age and score today, DEXA is not currently scheduled.    F/U  Return in about 1 year (around  09/18/2021) for Annual.  Barnett Applebaum, MD, [facility], [facility] 09/18/2020  9:00 AM

## 2020-10-11 ENCOUNTER — Other Ambulatory Visit: Payer: Self-pay

## 2020-10-11 ENCOUNTER — Ambulatory Visit
Admission: RE | Admit: 2020-10-11 | Discharge: 2020-10-11 | Disposition: A | Discharge status: Home / Self Care | Payer: BC Managed Care – PPO | Source: Ambulatory Visit | Attending: Obstetrics & Gynecology | Admitting: Obstetrics & Gynecology

## 2020-10-11 DIAGNOSIS — Z1231 Encounter for screening mammogram for malignant neoplasm of breast: Secondary | ICD-10-CM | POA: Diagnosis not present

## 2020-10-19 LAB — COLOGUARD: Cologuard: NEGATIVE

## 2020-10-25 LAB — COLOGUARD: COLOGUARD: NEGATIVE

## 2020-10-25 LAB — EXTERNAL GENERIC LAB PROCEDURE: COLOGUARD: NEGATIVE

## 2020-11-02 ENCOUNTER — Other Ambulatory Visit: Payer: Self-pay | Admitting: Obstetrics & Gynecology

## 2020-11-19 ENCOUNTER — Telehealth: Payer: Self-pay

## 2020-11-19 ENCOUNTER — Other Ambulatory Visit: Payer: Self-pay | Admitting: Obstetrics & Gynecology

## 2020-11-19 NOTE — Telephone Encounter (Signed)
Pharmacy aware

## 2020-11-19 NOTE — Telephone Encounter (Signed)
Pharmacy called wanting to  know if her progesterone was supposed to be 40% because ifs usually 2% and is it still 35m once a day?

## 2020-11-19 NOTE — Telephone Encounter (Signed)
2 % Linking in Epic required a standard progesterone percentage, but she gets her at [facility] compounded to a specific potency at 2 % 1 mL daily

## 2020-11-23 ENCOUNTER — Other Ambulatory Visit: Payer: Self-pay | Admitting: Family Medicine

## 2020-11-23 DIAGNOSIS — F339 Major depressive disorder, recurrent, unspecified: Secondary | ICD-10-CM

## 2020-11-23 NOTE — Telephone Encounter (Signed)
Ok to refill? Please advise. Thanks!

## 2020-11-23 NOTE — Telephone Encounter (Signed)
Requested medication (s) are due for refill today: yes  Requested medication (s) are on the active medication list: yes  Last refill:  08/27/2020  Future visit scheduled:no  Notes to clinic: due for labs Last labs were 12/2019   Requested Prescriptions  Pending Prescriptions Disp Refills   venlafaxine XR (EFFEXOR-XR) 75 MG 24 hr capsule [Pharmacy Med Name: VENLAFAXINE HCL ER 75 MG CAP] 90 capsule 0    Sig: TAKE 1 CAPSULE BY MOUTH EVERY DAY      Psychiatry: Antidepressants - SNRI - desvenlafaxine & venlafaxine Failed - 11/23/2020  9:36 AM      Failed - LDL in normal range and within 360 days    LDL Calculated  Date Value Ref Range Status  06/15/2017 97 0 - 99 mg/dL Final          Failed - Total Cholesterol in normal range and within 360 days    Cholesterol, Total  Date Value Ref Range Status  06/15/2017 191 100 - 199 mg/dL Final          Failed - Triglycerides in normal range and within 360 days    Triglycerides  Date Value Ref Range Status  06/15/2017 85 0 - 149 mg/dL Final          Passed - Completed PHQ-2 or PHQ-9 in the last 360 days      Passed - Last BP in normal range    BP Readings from Last 1 Encounters:  09/18/20 100/60          Passed - Valid encounter within last 6 months    Recent Outpatient Visits           4 months ago Acute conjunctivitis of right eye, unspecified acute conjunctivitis type   [place], [place], PA-C   10 months ago Acute non-recurrent pansinusitis   [place], [place]   11 months ago Fatigue, unspecified type   [facility] [place], [place], [place]   1 year ago Other fatigue   [place], [place], [place]   1 year ago Lateral epicondylitis of right elbow   [facility] Birdie Sons, MD

## 2021-01-06 ENCOUNTER — Other Ambulatory Visit: Payer: Self-pay | Admitting: Obstetrics & Gynecology

## 2021-01-06 MED ORDER — ESTRADIOL 0.05 MG/24HR TD PTTW: 1.0000 | MEDICATED_PATCH | TRANSDERMAL | 12 refills | Status: DC

## 2021-01-26 IMAGING — MG DIGITAL SCREENING BILATERAL MAMMOGRAM WITH TOMO AND CAD
6 of 10 series · 6 of 30 positions shown · non-contrast
Comparison: Previous exam(s).

CLINICAL DATA: Screening.

EXAM:
DIGITAL SCREENING BILATERAL MAMMOGRAM WITH TOMO AND CAD

[R MLO synth-2D]
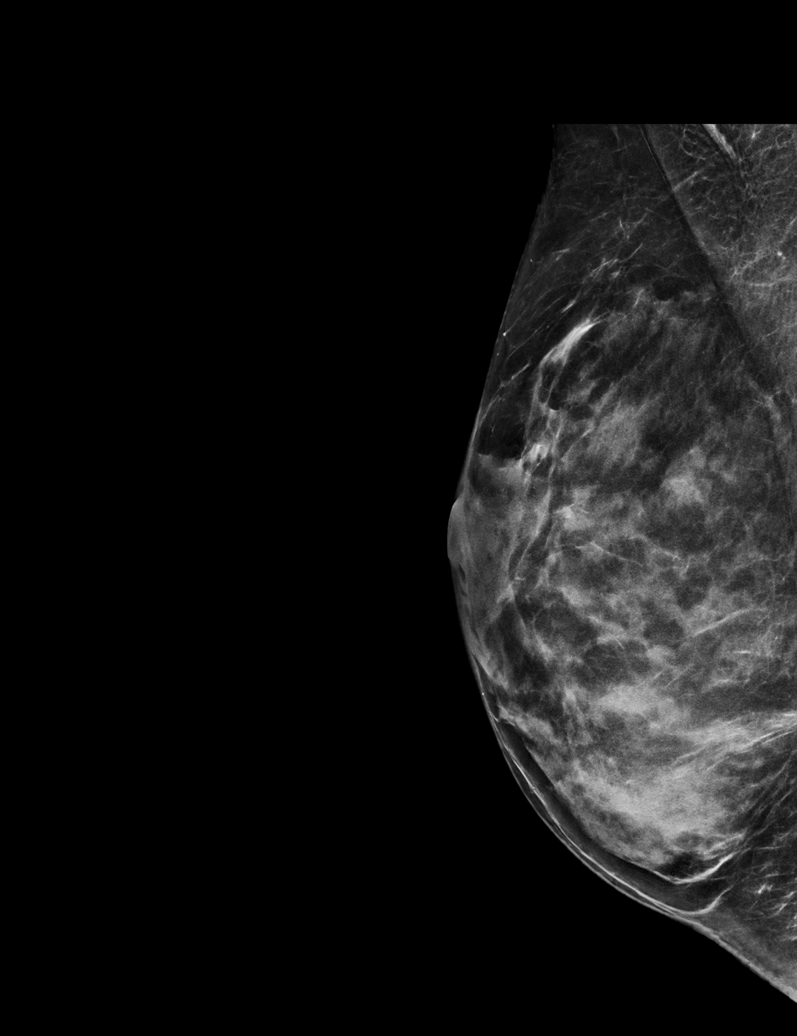

[R CC synth-2D (1 of 2)]
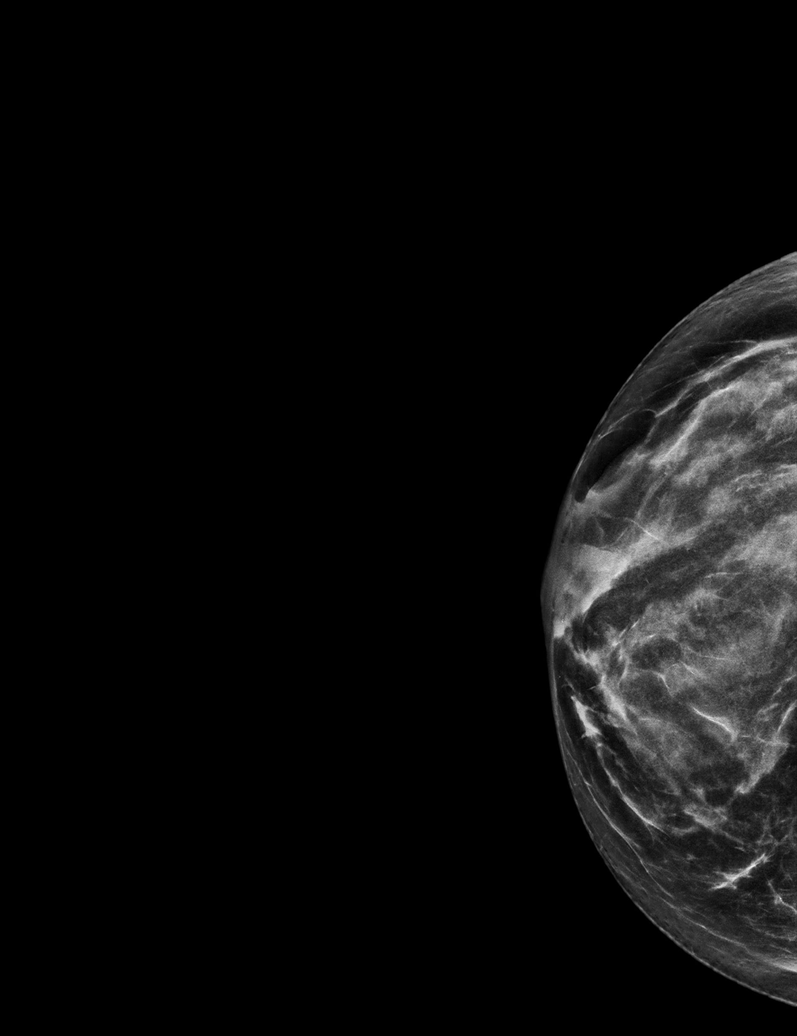

[R CC synth-2D (2 of 2)]
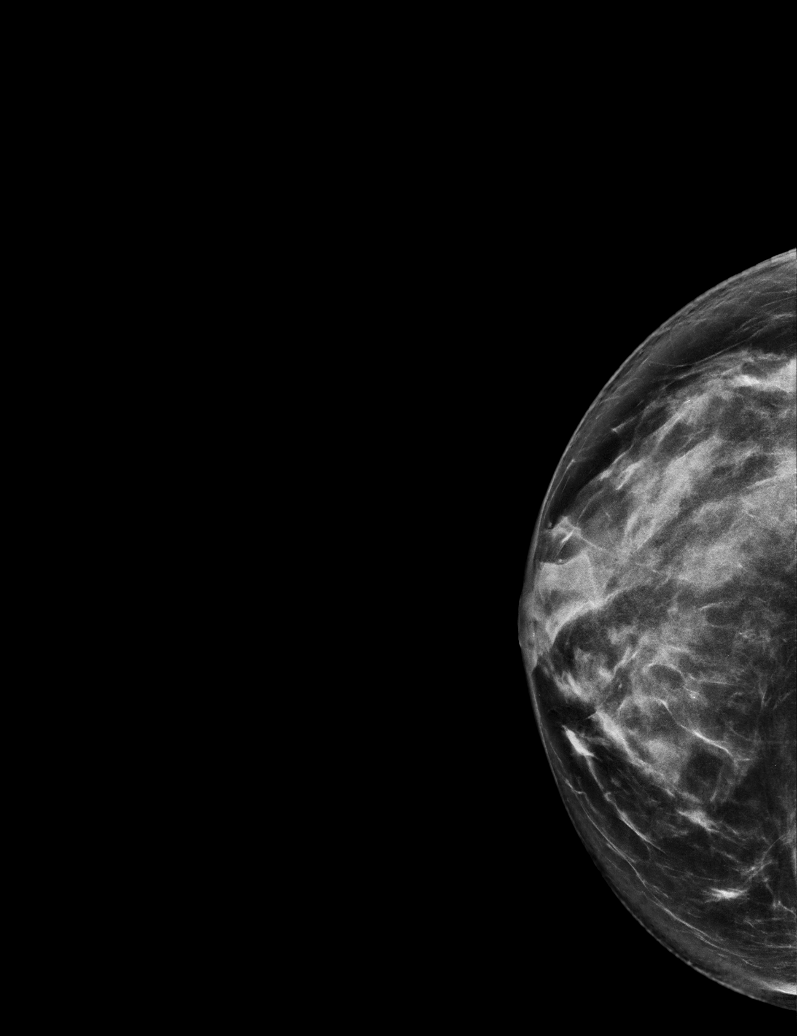

[L MLO synth-2D]
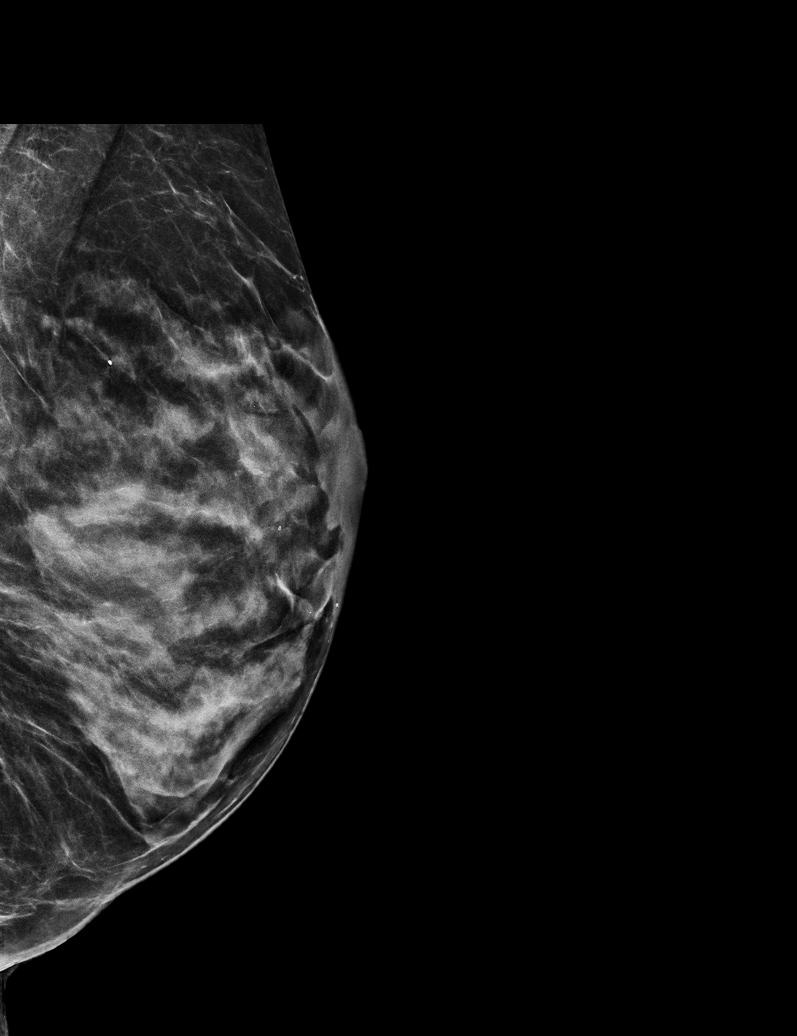

[L CC synth-2D]
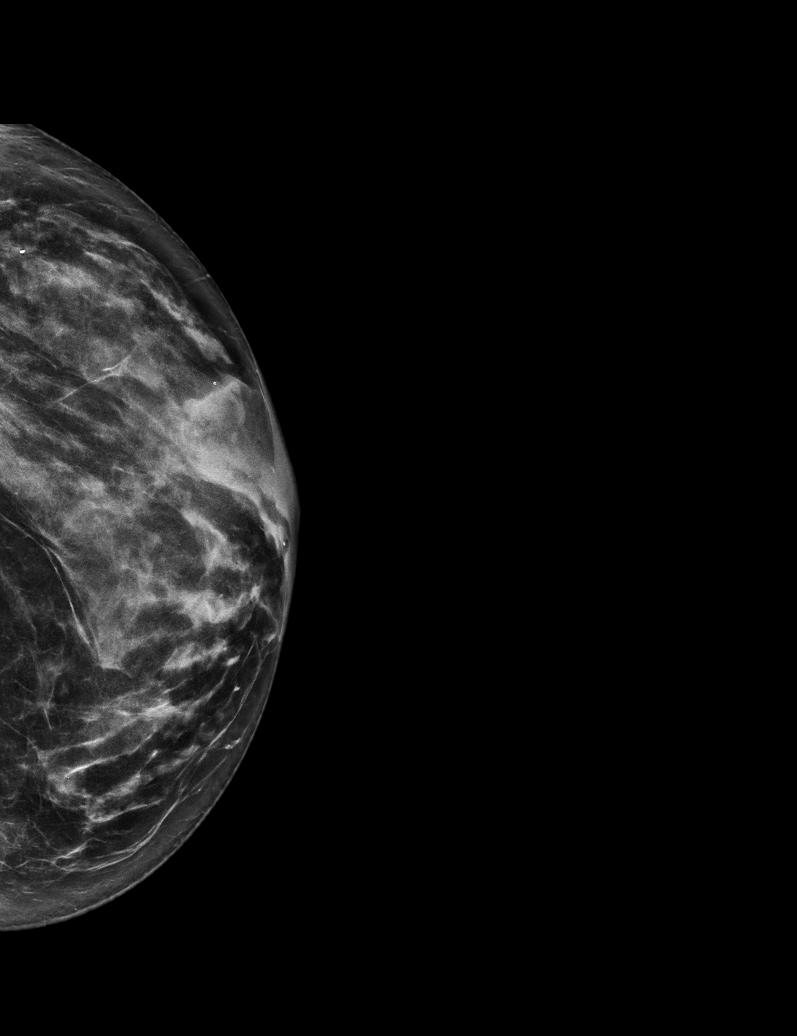

[R CC tomo · tomo slice 34/67.0]
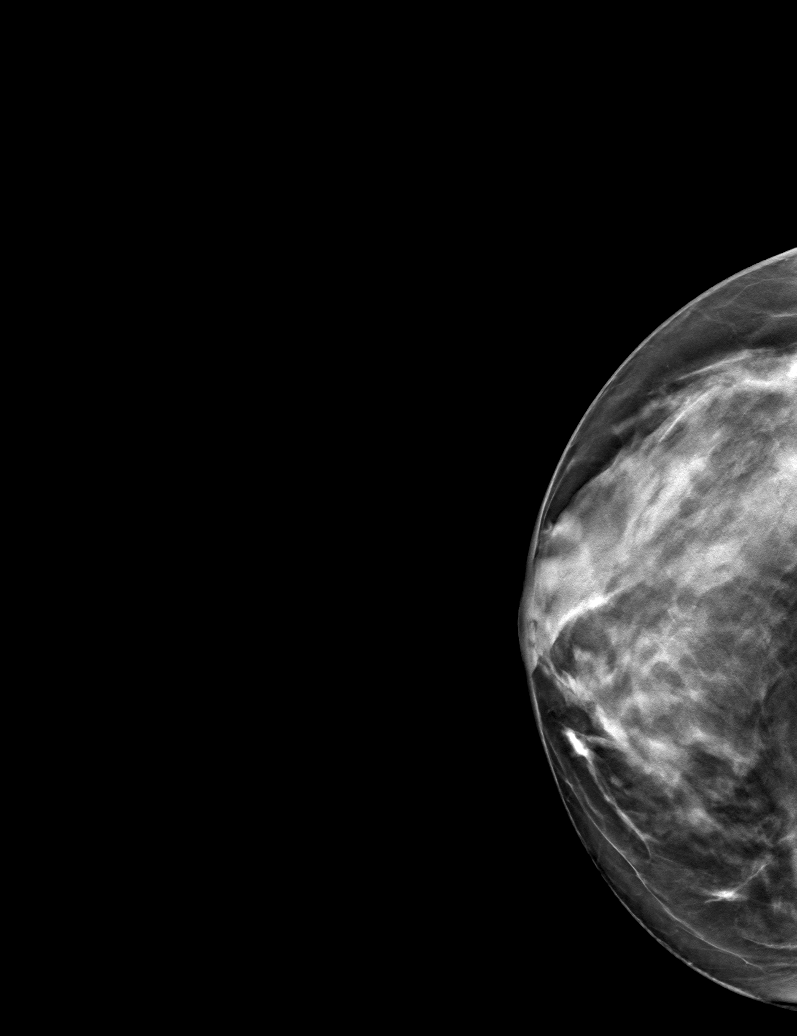

[6 of 30 positions shown; findings below may reference images not displayed]

ACR Breast Density Category d: The breast tissue is extremely dense,
which lowers the sensitivity of mammography
FINDINGS: There are no findings suspicious for malignancy. Images were
processed with CAD.
IMPRESSION: No mammographic evidence of malignancy. A result letter of this
screening mammogram will be mailed directly to the patient.

RECOMMENDATION:
Screening mammogram in one year. (Code:[id number]-[id number])

BI-RADS CATEGORY  1: Negative.

## 2021-03-01 ENCOUNTER — Other Ambulatory Visit: Payer: Self-pay

## 2021-03-01 ENCOUNTER — Ambulatory Visit: Payer: BC Managed Care – PPO | Admitting: Family Medicine

## 2021-03-01 ENCOUNTER — Encounter: Payer: Self-pay | Admitting: Family Medicine

## 2021-03-01 VITALS — BP 119/60 | HR 74 | Temp 98.6°F | Ht 66.0 in | Wt 125.7 lb

## 2021-03-01 DIAGNOSIS — Z114 Encounter for screening for human immunodeficiency virus [HIV]: Secondary | ICD-10-CM | POA: Insufficient documentation

## 2021-03-01 DIAGNOSIS — Z1321 Encounter for screening for nutritional disorder: Secondary | ICD-10-CM | POA: Insufficient documentation

## 2021-03-01 DIAGNOSIS — Z1159 Encounter for screening for other viral diseases: Secondary | ICD-10-CM | POA: Diagnosis not present

## 2021-03-01 DIAGNOSIS — F339 Major depressive disorder, recurrent, unspecified: Secondary | ICD-10-CM | POA: Diagnosis not present

## 2021-03-01 DIAGNOSIS — R5383 Other fatigue: Secondary | ICD-10-CM | POA: Diagnosis not present

## 2021-03-01 DIAGNOSIS — Z1283 Encounter for screening for malignant neoplasm of skin: Secondary | ICD-10-CM

## 2021-03-01 DIAGNOSIS — Z23 Encounter for immunization: Secondary | ICD-10-CM | POA: Diagnosis not present

## 2021-03-01 MED ORDER — VENLAFAXINE HCL ER 150 MG PO CP24: 150.0000 mg | ORAL_CAPSULE | Freq: Every day | ORAL | 1 refills | Status: DC

## 2021-03-01 NOTE — Assessment & Plan Note (Signed)
Worse lately, despite "great life" Medication increased Will f/u

## 2021-03-01 NOTE — Assessment & Plan Note (Signed)
Pulled for screening

## 2021-03-01 NOTE — Assessment & Plan Note (Signed)
>  10 yrs since last dose vax given today

## 2021-03-01 NOTE — Progress Notes (Signed)
Established patient visit   Patient: Joanna Cervantes   DOB: 03-22-1971   50 y.o. Female  MRN: [id number] Visit Date: 03/01/2021  Today's healthcare provider: Gwyneth Sprout, FNP   No chief complaint on file.  Subjective  -------------------------------------------------------------------------------------------------------------------- HPI  Medication refill on venlaxafine or possibly changing medication.Has been having escalations in depression symptoms lately and would like to get ahead of it before it gets the est of her.  Patient has a spot on her eyelid that she has started to notice and would like for it to be looked at. Not sure if it can be something addressed by PCP or dermatologist.    Patient Active Problem List   Diagnosis Date Noted   Vitamin D deficiency disease 07/22/2018   Annual physical exam 06/11/2017   Breast mass 10/28/2016   Abnormal Pap smear of cervix 10/28/2016   Allergic rhinitis 07/19/2015   Oral aphthae 07/19/2015   History of chicken pox 07/19/2015   Major depression 07/19/2015   Endometriosis 07/19/2015   Abscess, pulp, finger 07/19/2015   Migraine 07/19/2015   History of tobacco use 09/11/2009   Past Medical History:  Diagnosis Date   Abnormal Pap smear of cervix    Bulging of cervical intervertebral disc    sees chiropractor for alignment monthly   Depression    Endometriosis    s/p partial hysterectomy 2013   Family history of adverse reaction to anesthesia    Father - PONV   History of chicken pox    Irregular menses    Migraine headache    4-6x/yr   Motion sickness    back seat of car   PONV (postoperative nausea and vomiting)    severe   Vertigo    3 episodes.  last one approx 2 months ago   Allergies  Allergen Reactions   Mushroom Extract Complex Swelling    throat   Aspirin Hives   Celecoxib Diarrhea   Bupropion Rash and Swelling       Medications: Outpatient Medications Prior to Visit  Medication Sig    amoxicillin-clavulanate (AUGMENTIN) 875-125 MG tablet Take 1 tablet by mouth 2 (two) times daily. (Patient not taking: Reported on 09/18/2020)   DHEA 10 MG CAPS Take 5 mg by mouth 2 (two) times daily.   estradiol (DOTTI) 0.05 MG/24HR patch Place 1 patch (0.05 mg total) onto the skin 2 (two) times a week.   ibuprofen (ADVIL,MOTRIN) 200 MG tablet Take 400 mg by mouth every 6 (six) hours as needed.   Melatonin 10 MG TABS Take by mouth at bedtime as needed.   Progesterone 40 % CREA Apply as directed daily   PROGESTERONE PO Take by mouth.   venlafaxine XR (EFFEXOR-XR) 75 MG 24 hr capsule TAKE 1 CAPSULE BY MOUTH EVERY DAY   Facility-Administered Medications Prior to Visit  Medication Dose Route Frequency Provider   methylPREDNISolone acetate (DEPO-MEDROL) injection 40 mg  40 mg Intra-Lesional Once Birdie Sons, MD    Review of Systems  Last CBC Lab Results  Component Value Date   WBC 4.0 12/14/2019   HGB 13.0 12/14/2019   HCT 38.7 12/14/2019   MCV 89 12/14/2019   MCH 30.0 12/14/2019   RDW 13.3 12/14/2019   PLT 252 53/97/6734   Last metabolic panel Lab Results  Component Value Date   GLUCOSE 92 12/14/2019   NA 139 12/14/2019   K 4.3 12/14/2019   CL 100 12/14/2019   CO2 26 12/14/2019   BUN  16 12/14/2019   CREATININE 0.66 12/14/2019   GFRNONAA 106 12/14/2019   GFRAA 122 12/14/2019   CALCIUM 9.6 12/14/2019   PROT 6.6 12/14/2019   ALBUMIN 4.9 (H) 12/14/2019   LABGLOB 1.7 12/14/2019   AGRATIO 2.9 (H) 12/14/2019   BILITOT 0.2 12/14/2019   ALKPHOS 59 12/14/2019   AST 22 12/14/2019   ALT 13 12/14/2019   ANIONGAP 12 08/02/2011   Last lipids Lab Results  Component Value Date   CHOL 191 06/15/2017   HDL 77 06/15/2017   LDLCALC 97 06/15/2017   TRIG 85 06/15/2017   CHOLHDL 2.5 06/15/2017   Last hemoglobin A1c Lab Results  Component Value Date   HGBA1C 5.3 06/15/2017   Last thyroid functions Lab Results  Component Value Date   TSH 1.020 12/14/2019   Last vitamin  D Lab Results  Component Value Date   VD25OH 34.2 12/14/2019   Last vitamin B12 and Folate No results found for: VITAMINB12, FOLATE     Objective  -------------------------------------------------------------------------------------------------------------------- There were no vitals taken for this visit. BP Readings from Last 3 Encounters:  09/18/20 100/60  06/27/20 111/66  01/24/20 135/88   Wt Readings from Last 3 Encounters: 09/18/20 126 lb (57.2 kg) 06/27/20 128 lb 6.4 oz (58.2 kg) 01/09/20 123 lb (55.8 kg)       Physical Exam Vitals and nursing note reviewed.  Constitutional:      Appearance: She is normal weight.  HENT:     Head: Normocephalic and atraumatic.  Cardiovascular:     Rate and Rhythm: Regular rhythm.     Pulses: Normal pulses.     Heart sounds: No murmur heard.   No friction rub. No gallop.  Pulmonary:     Effort: Pulmonary effort is normal.     Breath sounds: Normal breath sounds.  Musculoskeletal:        General: Normal range of motion.  Skin:    General: Skin is warm.     Capillary Refill: Capillary refill takes less than 2 seconds.  Neurological:     General: No focal deficit present.     Mental Status: She is alert and oriented to person, place, and time.  Psychiatric:        Mood and Affect: Mood normal.        Behavior: Behavior normal.        Thought Content: Thought content normal.        Judgment: Judgment normal.      No results found for any visits on 03/01/21.  Assessment & Plan  ---------------------------------------------------------------------------------------------------------------------- Problem List Items Addressed This Visit       Other   Major depression - Primary   Relevant Medications   venlafaxine XR (EFFEXOR XR) 150 MG 24 hr capsule   Fatigue   Relevant Orders   CBC with Differential/Platelet   Comprehensive metabolic panel   Lipid panel   TSH   Iron, TIBC and Ferritin Panel   Encounter for  hepatitis C screening test for low risk patient   Relevant Orders   Hepatitis C Antibody   Encounter for screening for HIV   Relevant Orders   HIV antibody (with reflex)   Encounter for vitamin deficiency screening   Relevant Orders   Vitamin B12   Vitamin D (25 hydroxy)   Need for tetanus booster   Other Visit Diagnoses     Skin cancer screening       Relevant Orders   Ambulatory referral to Dermatology   Need for Tdap  vaccination       Relevant Orders   Tdap vaccine greater than or equal to 7yo IM (Completed)        3 month f/u to check medication dose change     I, Gwyneth Sprout, FNP, have reviewed all documentation for this visit. The documentation on 03/01/21 for the exam, diagnosis, procedures, and orders are all accurate and complete.   Gwyneth Sprout, [place] [phone] (phone) [phone] (fax)  [place]

## 2021-03-01 NOTE — Assessment & Plan Note (Signed)
Labs drawn in r/t fatigue and depression Will f/u

## 2021-03-01 NOTE — Assessment & Plan Note (Signed)
General concerns Labs checked

## 2021-03-04 LAB — CBC WITH DIFFERENTIAL/PLATELET
Basophils Absolute: 0 10*3/uL (ref 0.0–0.2)
Basos: 1 %
EOS (ABSOLUTE): 0 10*3/uL (ref 0.0–0.4)
Eos: 1 %
Hematocrit: 39 % (ref 34.0–46.6)
Hemoglobin: 13 g/dL (ref 11.1–15.9)
Immature Grans (Abs): 0 10*3/uL (ref 0.0–0.1)
Immature Granulocytes: 0 %
Lymphocytes Absolute: 1.8 10*3/uL (ref 0.7–3.1)
Lymphs: 42 %
MCH: 29.6 pg (ref 26.6–33.0)
MCHC: 33.3 g/dL (ref 31.5–35.7)
MCV: 89 fL (ref 79–97)
Monocytes Absolute: 0.2 10*3/uL (ref 0.1–0.9)
Monocytes: 6 %
Neutrophils Absolute: 2.2 10*3/uL (ref 1.4–7.0)
Neutrophils: 50 %
Platelets: 287 10*3/uL (ref 150–450)
RBC: 4.39 x10E6/uL (ref 3.77–5.28)
RDW: 13.1 % (ref 11.7–15.4)
WBC: 4.2 10*3/uL (ref 3.4–10.8)

## 2021-03-04 LAB — IRON,TIBC AND FERRITIN PANEL
Ferritin: 90 ng/mL (ref 15–150)
Iron Saturation: 44 % (ref 15–55)
Iron: 134 ug/dL (ref 27–159)
Total Iron Binding Capacity: 306 ug/dL (ref 250–450)
UIBC: 172 ug/dL (ref 131–425)

## 2021-03-04 LAB — COMPREHENSIVE METABOLIC PANEL
ALT: 16 IU/L (ref 0–32)
AST: 22 IU/L (ref 0–40)
Albumin/Globulin Ratio: 3.1 — ABNORMAL HIGH (ref 1.2–2.2)
Albumin: 5.2 g/dL — ABNORMAL HIGH (ref 3.8–4.8)
Alkaline Phosphatase: 56 IU/L (ref 44–121)
BUN/Creatinine Ratio: 20 (ref 9–23)
BUN: 14 mg/dL (ref 6–24)
Bilirubin Total: 0.3 mg/dL (ref 0.0–1.2)
CO2: 26 mmol/L (ref 20–29)
Calcium: 9.7 mg/dL (ref 8.7–10.2)
Chloride: 99 mmol/L (ref 96–106)
Creatinine, Ser: 0.7 mg/dL (ref 0.57–1.00)
Globulin, Total: 1.7 g/dL (ref 1.5–4.5)
Glucose: 92 mg/dL (ref 65–99)
Potassium: 4.3 mmol/L (ref 3.5–5.2)
Sodium: 139 mmol/L (ref 134–144)
Total Protein: 6.9 g/dL (ref 6.0–8.5)
eGFR: 107 mL/min/{1.73_m2} (ref >59)

## 2021-03-04 LAB — LIPID PANEL
Chol/HDL Ratio: 2.9 ratio (ref 0.0–4.4)
Cholesterol, Total: 228 mg/dL — ABNORMAL HIGH (ref 100–199)
HDL: 80 mg/dL (ref >39)
LDL Chol Calc (NIH): 114 mg/dL — ABNORMAL HIGH (ref 0–99)
Triglycerides: 201 mg/dL — ABNORMAL HIGH (ref 0–149)
VLDL Cholesterol Cal: 34 mg/dL (ref 5–40)

## 2021-03-04 LAB — HEPATITIS C ANTIBODY: Hep C Virus Ab: <0.1 s/co ratio (ref 0.0–0.9)

## 2021-03-04 LAB — HIV ANTIBODY (ROUTINE TESTING W REFLEX): HIV Screen 4th Generation wRfx: NONREACTIVE

## 2021-03-04 LAB — VITAMIN B12: Vitamin B-12: 370 pg/mL (ref 232–1245)

## 2021-03-04 LAB — TSH: TSH: 0.98 u[IU]/mL (ref 0.450–4.500)

## 2021-03-04 LAB — VITAMIN D 25 HYDROXY (VIT D DEFICIENCY, FRACTURES): Vit D, 25-Hydroxy: 47.7 ng/mL (ref 30.0–100.0)

## 2021-06-05 ENCOUNTER — Encounter: Payer: Self-pay | Admitting: Family Medicine

## 2021-06-05 ENCOUNTER — Other Ambulatory Visit: Payer: Self-pay

## 2021-06-05 ENCOUNTER — Ambulatory Visit: Payer: BC Managed Care – PPO | Admitting: Family Medicine

## 2021-06-05 VITALS — BP 135/73 | HR 73 | Resp 16 | Wt 126.7 lb

## 2021-06-05 DIAGNOSIS — F339 Major depressive disorder, recurrent, unspecified: Secondary | ICD-10-CM

## 2021-06-05 DIAGNOSIS — F5104 Psychophysiologic insomnia: Secondary | ICD-10-CM | POA: Insufficient documentation

## 2021-06-05 DIAGNOSIS — F419 Anxiety disorder, unspecified: Secondary | ICD-10-CM | POA: Insufficient documentation

## 2021-06-05 DIAGNOSIS — Z23 Encounter for immunization: Secondary | ICD-10-CM | POA: Diagnosis not present

## 2021-06-05 MED ORDER — TRAZODONE HCL 50 MG PO TABS: 25.0000 mg | ORAL_TABLET | Freq: Every evening | ORAL | 3 refills | Status: DC | PRN

## 2021-06-05 MED ORDER — BUSPIRONE HCL 7.5 MG PO TABS: 7.5000 mg | ORAL_TABLET | Freq: Two times a day (BID) | ORAL | 1 refills | Status: DC

## 2021-06-05 NOTE — Progress Notes (Signed)
Established patient visit   Patient: Joanna Cervantes   DOB: [birth year]   50 y.o. Female  MRN: [id number] Visit Date: 06/05/2021  Today's healthcare provider: Gwyneth Sprout, FNP   Chief Complaint  Patient presents with   Depression   Subjective    HPI  Depression, Follow-up  She  was last seen for this 3 months ago. Changes made at last visit include increased Effexor to 149m.   She reports excellent compliance with treatment. She is not having side effects.   She reports excellent tolerance of treatment. Current symptoms include: depressed mood, difficulty concentrating, fatigue, and insomnia She feels she is Unchanged since last visit, patient states depressed mood has improved.   [facility] 03/01/2021 06/27/2020 02/04/2017  Decreased Interest 1 0 0  Down, Depressed, Hopeless 2 0 0  PHQ - 2 Score 3 0 0  Altered sleeping 2 2 1   Tired, decreased energy 2 1 1   Change in appetite 0 0 0  Feeling bad or failure about yourself  0 0 0  Trouble concentrating 0 0 1  Moving slowly or fidgety/restless 0 0 1  Suicidal thoughts 0 0 0  PHQ-9 Score 7 3 4   Difficult doing work/chores Somewhat difficult Not difficult at all Not difficult at all    -----------------------------------------------------------------------------------------   Medications: Outpatient Medications Prior to Visit  Medication Sig   estradiol (DOTTI) 0.05 MG/24HR patch Place 1 patch (0.05 mg total) onto the skin 2 (two) times a week.   ibuprofen (ADVIL,MOTRIN) 200 MG tablet Take 400 mg by mouth every 6 (six) hours as needed.   Melatonin 10 MG TABS Take by mouth at bedtime as needed.   Progesterone 40 % CREA Apply as directed daily   venlafaxine XR (EFFEXOR XR) 150 MG 24 hr capsule Take 1 capsule (150 mg total) by mouth daily with breakfast.   [DISCONTINUED] DHEA 10 MG CAPS Take 5 mg by mouth 2 (two) times daily.   Facility-Administered Medications Prior to Visit  Medication Dose  Route Frequency Provider   methylPREDNISolone acetate (DEPO-MEDROL) injection 40 mg  40 mg Intra-Lesional Once FBirdie Sons MD    Review of Systems     Objective    BP 135/73   Pulse 73   Resp 16   Wt 126 lb 11.2 oz (57.5 kg)   SpO2 100%   BMI 20.45 kg/m   Physical Exam Vitals and nursing note reviewed.  Constitutional:      General: She is not in acute distress.    Appearance: Normal appearance. She is normal weight. She is not ill-appearing, toxic-appearing or diaphoretic.  HENT:     Head: Normocephalic and atraumatic.  Cardiovascular:     Rate and Rhythm: Normal rate and regular rhythm.     Pulses: Normal pulses.     Heart sounds: Normal heart sounds. No murmur heard.   No friction rub. No gallop.  Pulmonary:     Effort: Pulmonary effort is normal. No respiratory distress.     Breath sounds: Normal breath sounds. No stridor. No wheezing, rhonchi or rales.  Chest:     Chest wall: No tenderness.  Abdominal:     General: Bowel sounds are normal.     Palpations: Abdomen is soft.  Musculoskeletal:        General: No swelling, tenderness, deformity or signs of injury. Normal range of motion.     Right lower leg: No edema.     Left lower leg:  No edema.  Skin:    General: Skin is warm and dry.     Capillary Refill: Capillary refill takes less than 2 seconds.     Coloration: Skin is not jaundiced or pale.     Findings: No bruising, erythema, lesion or rash.  Neurological:     General: No focal deficit present.     Mental Status: She is alert and oriented to person, place, and time. Mental status is at baseline.     Cranial Nerves: No cranial nerve deficit.     Sensory: No sensory deficit.     Motor: No weakness.     Coordination: Coordination normal.  Psychiatric:        Mood and Affect: Mood normal.        Behavior: Behavior normal.        Thought Content: Thought content normal.        Judgment: Judgment normal.     No results found for any visits on  06/05/21.  Assessment & Plan     Problem List Items Addressed This Visit       Other   Major depression - Primary   Relevant Medications   busPIRone (BUSPAR) 7.5 MG tablet   traZODone (DESYREL) 50 MG tablet   Psychophysiological insomnia   Relevant Medications   traZODone (DESYREL) 50 MG tablet   Anxiety   Relevant Medications   busPIRone (BUSPAR) 7.5 MG tablet   traZODone (DESYREL) 50 MG tablet    Return in about 6 weeks (around 07/17/2021) for anxiety and depression.      Vonna Kotyk, FNP, have reviewed all documentation for this visit. The documentation on 06/05/21 for the exam, diagnosis, procedures, and orders are all accurate and complete.    Gwyneth Sprout, [place] [phone] (phone) [phone] (fax)  [place]

## 2021-07-10 ENCOUNTER — Other Ambulatory Visit: Payer: Self-pay | Admitting: Family Medicine

## 2021-07-10 DIAGNOSIS — F339 Major depressive disorder, recurrent, unspecified: Secondary | ICD-10-CM

## 2021-07-19 ENCOUNTER — Other Ambulatory Visit: Payer: Self-pay

## 2021-07-19 ENCOUNTER — Ambulatory Visit: Payer: BC Managed Care – PPO | Admitting: Family Medicine

## 2021-07-19 ENCOUNTER — Encounter: Payer: Self-pay | Admitting: Family Medicine

## 2021-07-19 VITALS — BP 109/55 | HR 72 | Resp 15 | Wt 129.1 lb

## 2021-07-19 DIAGNOSIS — Z1231 Encounter for screening mammogram for malignant neoplasm of breast: Secondary | ICD-10-CM

## 2021-07-19 DIAGNOSIS — Z87898 Personal history of other specified conditions: Secondary | ICD-10-CM

## 2021-07-19 DIAGNOSIS — F339 Major depressive disorder, recurrent, unspecified: Secondary | ICD-10-CM

## 2021-07-19 DIAGNOSIS — F5104 Psychophysiologic insomnia: Secondary | ICD-10-CM | POA: Diagnosis not present

## 2021-07-19 MED ORDER — TRAZODONE HCL 50 MG PO TABS: 50.0000 mg | ORAL_TABLET | Freq: Every evening | ORAL | 1 refills | Status: DC | PRN

## 2021-07-19 MED ORDER — VENLAFAXINE HCL ER 75 MG PO CP24
75.0000 mg | ORAL_CAPSULE | Freq: Every day | ORAL | 1 refills | Status: DC
Start: 2021-07-19 — End: 2021-10-22

## 2021-07-19 NOTE — Assessment & Plan Note (Addendum)
Chronic, stable improving since addition of trazodone Request additional increase of Effexor to assist given failure and discontinuation of Buspar I've explained to her that drugs of the SSRI class can have side effects such as weight gain, sexual dysfunction, insomnia, headache, nausea. These medications are generally effective at alleviating symptoms of anxiety and/or depression. Let me know if significant side effects do occur. Will Start additional 75 mg Effexor daily Discussed potential side effects, incl GI upset, sexual dysfunction, increased anxiety, and SI Discussed that it can take 6-8 weeks to reach full efficacy Contracted for safety - no SI/HI Discussed synergistic effects of medications and therapy  Continues to see therapist Continues to work on relationship with God and her spouse

## 2021-07-19 NOTE — Assessment & Plan Note (Signed)
Due for annual screening; denies complaints  In office breast exam not completed Hx of previous breast mass; s/p removal

## 2021-07-19 NOTE — Patient Instructions (Addendum)
Schedule mammogram 10/12/21 or later.  I've explained to her that drugs of the SSRI class can have side effects such as weight gain, sexual dysfunction, insomnia, headache, nausea. These medications are generally effective at alleviating symptoms of anxiety and/or depression. Let me know if significant side effects do occur.  Will Start additional 75 mg of Effexor daily Discussed potential side effects, incl GI upset, sexual dysfunction, increased anxiety, and SI Discussed that it can take 6-8 weeks to reach full efficacy Contracted for safety - no SI/HI Discussed synergistic effects of medications and therapy

## 2021-07-19 NOTE — Assessment & Plan Note (Signed)
Has gone from sleeping 2 hrs per night to sleeping 6 hrs per night; taking 50 mg Refills provided

## 2021-07-19 NOTE — Progress Notes (Addendum)
Established patient visit   Patient: Joanna Cervantes   DOB: 05/09/1971   51 y.o. Female  MRN: [id number] Visit Date: 07/19/2021  Today's healthcare provider: Gwyneth Sprout, FNP   Chief Complaint  Patient presents with   Depression   Anxiety   Subjective    HPI  Anxiety, Follow-up  She was last seen for anxiety 1 months ago. Changes made at last visit include none.   She reports fair compliance on Buspar patient reports; good compliance on Trazodone. She reports fair to poor tolerance of treatment. She is having side effects. Patient reports dizziness on Buspar and states that she d/c 2 weeks ago.  She feels her anxiety is mild and Improved since last visit.  Symptoms: No chest pain No difficulty concentrating  Yes dizziness Yes fatigue  Yes feelings of losing control No insomnia  No irritable No palpitations  No panic attacks Yes racing thoughts  No shortness of breath No sweating  No tremors/shakes    GAD-7 Results No flowsheet data found.  PHQ-9 Scores PHQ9 SCORE ONLY 07/19/2021 03/01/2021 06/27/2020  PHQ-9 Total Score 3 7 3     ---------------------------------------------------------------------------------------------------  Depression, Follow-up  She  was last seen for this 1 months ago. Changes made at last visit include none.   She reports excellent compliance with treatment. She is not having side effects.   She reports excellent tolerance of treatment. Current symptoms include: depressed mood and fatigue She feels she is Unchanged since last visit.  Depression screen [facility] 2/9 07/19/2021 03/01/2021 06/27/2020  Decreased Interest 0 1 0  Down, Depressed, Hopeless 0 2 0  PHQ - 2 Score 0 3 0  Altered sleeping 1 2 2   Tired, decreased energy 1 2 1   Change in appetite 1 0 0  Feeling bad or failure about yourself  0 0 0  Trouble concentrating 0 0 0  Moving slowly or fidgety/restless 0 0 0  Suicidal thoughts 0 0 0  PHQ-9 Score 3 7 3   Difficult  doing work/chores Not difficult at all Somewhat difficult Not difficult at all    -----------------------------------------------------------------------------------------  Medications: Outpatient Medications Prior to Visit  Medication Sig   estradiol (DOTTI) 0.05 MG/24HR patch Place 1 patch (0.05 mg total) onto the skin 2 (two) times a week.   ibuprofen (ADVIL,MOTRIN) 200 MG tablet Take 400 mg by mouth every 6 (six) hours as needed.   Melatonin 10 MG TABS Take by mouth at bedtime as needed.   Progesterone 40 % CREA Apply as directed daily   venlafaxine XR (EFFEXOR-XR) 150 MG 24 hr capsule TAKE 1 CAPSULE BY MOUTH DAILY WITH BREAKFAST   [DISCONTINUED] busPIRone (BUSPAR) 7.5 MG tablet Take 1 tablet (7.5 mg total) by mouth 2 (two) times daily.   [DISCONTINUED] traZODone (DESYREL) 50 MG tablet Take 0.5-1 tablets (25-50 mg total) by mouth at bedtime as needed for sleep.   Facility-Administered Medications Prior to Visit  Medication Dose Route Frequency Provider   methylPREDNISolone acetate (DEPO-MEDROL) injection 40 mg  40 mg Intra-Lesional Once Birdie Sons, MD    Review of Systems     Objective    BP (!) 109/55    Pulse 72    Resp 15    Wt 129 lb 1.6 oz (58.6 kg)    SpO2 100%    BMI 20.84 kg/m    Physical Exam Vitals and nursing note reviewed.  Constitutional:      General: She is not in acute distress.  Appearance: Normal appearance. She is normal weight. She is not ill-appearing, toxic-appearing or diaphoretic.  HENT:     Head: Normocephalic and atraumatic.  Cardiovascular:     Rate and Rhythm: Normal rate and regular rhythm.     Pulses: Normal pulses.     Heart sounds: Normal heart sounds. No murmur heard.   No friction rub. No gallop.  Pulmonary:     Effort: Pulmonary effort is normal. No respiratory distress.     Breath sounds: Normal breath sounds. No stridor. No wheezing, rhonchi or rales.  Chest:     Chest wall: No tenderness.  Abdominal:     General: Bowel  sounds are normal.     Palpations: Abdomen is soft.  Musculoskeletal:        General: No swelling, tenderness, deformity or signs of injury. Normal range of motion.     Right lower leg: No edema.     Left lower leg: No edema.  Skin:    General: Skin is warm and dry.     Capillary Refill: Capillary refill takes less than 2 seconds.     Coloration: Skin is not jaundiced or pale.     Findings: No bruising, erythema, lesion or rash.  Neurological:     General: No focal deficit present.     Mental Status: She is alert and oriented to person, place, and time. Mental status is at baseline.     Cranial Nerves: No cranial nerve deficit.     Sensory: No sensory deficit.     Motor: No weakness.     Coordination: Coordination normal.  Psychiatric:        Mood and Affect: Mood normal.        Behavior: Behavior normal.        Thought Content: Thought content normal.        Judgment: Judgment normal.    No results found for any visits on 07/19/21.  Assessment & Plan     Problem List Items Addressed This Visit       Other   Major depression - Primary    Chronic, stable improving since addition of trazodone Request additional increase of Effexor to assist given failure and discontinuation of Buspar I've explained to her that drugs of the SSRI class can have side effects such as weight gain, sexual dysfunction, insomnia, headache, nausea. These medications are generally effective at alleviating symptoms of anxiety and/or depression. Let me know if significant side effects do occur. Will Start additional 75 mg Effexor daily Discussed potential side effects, incl GI upset, sexual dysfunction, increased anxiety, and SI Discussed that it can take 6-8 weeks to reach full efficacy Contracted for safety - no SI/HI Discussed synergistic effects of medications and therapy  Continues to see therapist Continues to work on relationship with God and her spouse      Relevant Medications   traZODone  (DESYREL) 50 MG tablet   venlafaxine XR (EFFEXOR XR) 75 MG 24 hr capsule   Psychophysiological insomnia    Has gone from sleeping 2 hrs per night to sleeping 6 hrs per night; taking 50 mg Refills provided      Relevant Medications   traZODone (DESYREL) 50 MG tablet   Hx of dizziness    Complaints of dizziness with associated vertigo following initiation of Buspar for anxiety Was using Buspar with Effexor to assist with anxiety/depression Self wean off Buspar; symptoms subsided No residual symptoms since wean      Encounter for screening mammogram for  malignant neoplasm of breast    Due for annual screening; denies complaints  In office breast exam not completed Hx of previous breast mass; s/p removal      Relevant Orders   MM 3D SCREEN BREAST BILATERAL     Return in about 3 months (around 10/17/2021) for annual examination.      Vonna Kotyk, FNP, have reviewed all documentation for this visit. The documentation on 07/19/21 for the exam, diagnosis, procedures, and orders are all accurate and complete.    Gwyneth Sprout, [place] [phone] (phone) [phone] (fax)  [place]

## 2021-07-19 NOTE — Assessment & Plan Note (Signed)
Complaints of dizziness with associated vertigo following initiation of Buspar for anxiety Was using Buspar with Effexor to assist with anxiety/depression Self wean off Buspar; symptoms subsided No residual symptoms since wean

## 2021-08-15 ENCOUNTER — Other Ambulatory Visit: Payer: Self-pay | Admitting: Obstetrics & Gynecology

## 2021-10-16 NOTE — Progress Notes (Signed)
? ? ?[employer] as a Education administrator for Gwyneth Sprout, FNP.,have documented all relevant documentation on the behalf of Gwyneth Sprout, FNP,as directed by  Gwyneth Sprout, FNP while in the presence of Gwyneth Sprout, FNP.  ? ?Complete physical exam ? ? ?Patient: Joanna Cervantes   DOB: 1970-11-12   50 y.o. Female  MRN: [id number] ?Visit Date: 10/17/2021 ? ?Today's healthcare provider: Gwyneth Sprout, FNP  ? ?Re Introduced to nurse practitioner role and practice setting.  All questions answered.  Discussed provider/patient relationship and expectations. ? ?Chief Complaint  ?Patient presents with  ? Annual Exam  ? ?Subjective  ?  ?HPI  ?Joanna Cervantes is a 51 y.o. female who presents today for a complete physical exam.  ?She reports consuming a general diet. Home exercise routine includes walking/cardio. She generally feels well. She reports sleeping poorly, patient reports difficulty staying asleep even on medication. She does not have additional problems to discuss today.  ? ?Last reported ?Cologuard-10/19/20 ?Pap- 09/16/19 ?Mammo- scheduled 10/22/21 ? ?Past Medical History:  ?Diagnosis Date  ? Abnormal Pap smear of cervix   ? Bulging of cervical intervertebral disc   ? sees chiropractor for alignment monthly  ? Depression   ? Endometriosis   ? s/p partial hysterectomy 2013  ? Family history of adverse reaction to anesthesia   ? Father - PONV  ? History of chicken pox   ? Irregular menses   ? Migraine headache   ? 4-6x/yr  ? Motion sickness   ? back seat of car  ? PONV (postoperative nausea and vomiting)   ? severe  ? Vertigo   ? 3 episodes.  last one approx 2 months ago  ? ?Past Surgical History:  ?Procedure Laterality Date  ? ABDOMINAL HYSTERECTOMY  11/2011   ? not due to cancer; Endometriosis and Ovarian cyst. Still has cervix  ? APPENDECTOMY  08/13/2011  ? Diagnostic Laproscopy, prophylactic appendectomy, and drainage of right ovarian cyst done by Dr. Bary Castilla at [facility]  ? BILATERAL SALPINGOOPHORECTOMY    ?  BREAST ENHANCEMENT SURGERY  2000  ? reduction - DR. MORRISON  ? CERVIX LESION DESTRUCTION    ? COLPOSCOPY  02/20/2003  ? ENDOMETRIAL ABLATION  09/10/2006  ? BVD  ? HYSTEROSCOPY  09/10/2006  ? D&C; WITH NOVASURE AND TUBAL LIGATION; BVD  ? LAPAROSCOPIC SUPRACERVICAL HYSTERECTOMY    ? LARYNGOSCOPY  11/03/2006  ? DR MCQUEEN, SMALL CYSTIC AREA OF THE RIGHT ANTERIOR VOCAL FOLD.  ? LEEP  2004  ? MM BREAST STEREO BIOPSY LEFT ([place] HX)  08/13/2009  ? REDUCTION MAMMAPLASTY Bilateral 2000  ? SHOULDER ARTHROSCOPY Right 05/06/2017  ? Procedure: Limited arthroscopic debridement, arthroscopic subacromial decompression, mini-open rotator cuff repair, and mini-open biceps tenodesis, right shoulder;  Surgeon: Corky Mull, MD;  Location: [facility];  Service: Orthopedics;  Laterality: Right;  ? TUBAL LIGATION  2008  ? Laporoscopic tubal ligation by cautery  ? TUBAL LIGATION  09/10/2006  ? BVD  ? ?Social History  ? ?Socioeconomic History  ? Marital status: Married  ?  Spouse name: Not on file  ? Number of children: 0  ? Years of education: Not on file  ? Highest education level: Not on file  ?Occupational History  ? Not on file  ?Tobacco Use  ? Smoking status: Former  ?  Packs/day: 0.50  ?  Types: Cigarettes  ?  Quit date: 07/15/2007  ?  Years since quitting: 14.2  ? Smokeless tobacco: Never  ?  Vaping Use  ? Vaping Use: Never used  ?Substance and Sexual Activity  ? Alcohol use: Yes  ?  Alcohol/week: 4.0 standard drinks  ?  Types: 4 Glasses of wine per week  ?  Comment: occasional use  ? Drug use: No  ? Sexual activity: Yes  ?  Birth control/protection: None  ?Other Topics Concern  ? Not on file  ?Social History Narrative  ? Not on file  ? ?Social Determinants of Health  ? ?Financial Resource Strain: Not on file  ?Food Insecurity: Not on file  ?Transportation Needs: Not on file  ?Physical Activity: Not on file  ?Stress: Not on file  ?Social Connections: Not on file  ?Intimate Partner Violence: Not on file  ? ?Family Status   ?Relation Name Status  ? Mother  Alive  ? Father  Alive  ? Sister  Alive  ? Brother  Alive  ? MGF  Deceased  ? PGF  Deceased  ? Neg Hx  (Not Specified)  ? ?Family History  ?Problem Relation Age of Onset  ? Hypertension Mother   ? Heart attack Maternal Grandfather   ? Parkinson's disease Maternal Grandfather   ? Cancer Maternal Grandfather   ?     PROSTATE  ? Heart attack Paternal Grandfather   ? Lung cancer Paternal Grandfather   ? Breast cancer Neg Hx   ? ?Allergies  ?Allergen Reactions  ? Mushroom Extract Complex Swelling  ?  throat  ? Buspar [Buspirone]   ?  Vertigo, dizziness  ? Aspirin Hives  ? Celecoxib Diarrhea  ? Bupropion Rash and Swelling  ?  ?Patient Care Team: ?Gwyneth Sprout, FNP as PCP - General (Family Medicine) ?Ammie Dalton, Okey Regal, MD (Unknown Physician Specialty) ?[name]  ? ?Medications: ?Outpatient Medications Prior to Visit  ?Medication Sig  ? estradiol (DOTTI) 0.05 MG/24HR patch Place 1 patch (0.05 mg total) onto the skin 2 (two) times a week.  ? ibuprofen (ADVIL,MOTRIN) 200 MG tablet Take 400 mg by mouth every 6 (six) hours as needed.  ? Melatonin 10 MG TABS Take by mouth at bedtime as needed.  ? Progesterone 40 % CREA Compound prepare and dispense as in past, daily use.  ? traZODone (DESYREL) 50 MG tablet Take 1 tablet (50 mg total) by mouth at bedtime as needed for sleep.  ? venlafaxine XR (EFFEXOR XR) 75 MG 24 hr capsule Take 1 capsule (75 mg total) by mouth daily with breakfast.  ? venlafaxine XR (EFFEXOR-XR) 150 MG 24 hr capsule TAKE 1 CAPSULE BY MOUTH DAILY WITH BREAKFAST  ? ?Facility-Administered Medications Prior to Visit  ?Medication Dose Route Frequency Provider  ? methylPREDNISolone acetate (DEPO-MEDROL) injection 40 mg  40 mg Intra-Lesional Once Birdie Sons, MD  ? ? ?Review of Systems  ?Gastrointestinal:  Positive for abdominal distention and constipation.  ?Allergic/Immunologic: Positive for environmental allergies and food allergies.  ?Neurological:  Positive for  headaches.  ?Psychiatric/Behavioral:  Positive for sleep disturbance.   ?All other systems reviewed and are negative. ? ?All acute on chronic concerns; pt does not wish to address further. ? ? ? Objective  ?  ?BP 112/68   Pulse 88   Temp 97.7 ?F (36.5 ?C) (Temporal)   Resp 16   Ht 5' 6"  (1.676 m)   Wt 126 lb 8 oz (57.4 kg)   SpO2 100%   BMI 20.42 kg/m?  ? ?Physical Exam ?Vitals and nursing note reviewed.  ?Constitutional:   ?   General: She is awake. She  is not in acute distress. ?   Appearance: Normal appearance. She is well-developed, well-groomed and normal weight. She is not ill-appearing, toxic-appearing or diaphoretic.  ?HENT:  ?   Head: Normocephalic and atraumatic.  ?   Jaw: There is normal jaw occlusion. No trismus, tenderness, swelling or pain on movement.  ?   Right Ear: Hearing, tympanic membrane, ear canal and external ear normal. There is no impacted cerumen.  ?   Left Ear: Hearing, tympanic membrane, ear canal and external ear normal. There is no impacted cerumen.  ?   Nose: Nose normal. No congestion or rhinorrhea.  ?   Right Turbinates: Not enlarged, swollen or pale.  ?   Left Turbinates: Not enlarged, swollen or pale.  ?   Right Sinus: No maxillary sinus tenderness or frontal sinus tenderness.  ?   Left Sinus: No maxillary sinus tenderness or frontal sinus tenderness.  ?   Mouth/Throat:  ?   Lips: Pink.  ?   Mouth: Mucous membranes are moist. No injury.  ?   Tongue: No lesions.  ?   Pharynx: Oropharynx is clear. Uvula midline. No pharyngeal swelling, oropharyngeal exudate, posterior oropharyngeal erythema or uvula swelling.  ?   Tonsils: No tonsillar exudate or tonsillar abscesses.  ?Eyes:  ?   General: Lids are normal. Lids are everted, no foreign bodies appreciated. Vision grossly intact. Gaze aligned appropriately. No allergic shiner or visual field deficit.    ?   Right eye: No discharge.     ?   Left eye: No discharge.  ?   Extraocular Movements: Extraocular movements intact.  ?    Conjunctiva/sclera: Conjunctivae normal.  ?   Right eye: Right conjunctiva is not injected. No exudate. ?   Left eye: Left conjunctiva is not injected. No exudate. ?   Pupils: Pupils are equal, round, and reactive to light.

## 2021-10-17 ENCOUNTER — Ambulatory Visit (INDEPENDENT_AMBULATORY_CARE_PROVIDER_SITE_OTHER): Payer: BC Managed Care – PPO | Admitting: Family Medicine

## 2021-10-17 ENCOUNTER — Encounter: Payer: Self-pay | Admitting: Family Medicine

## 2021-10-17 VITALS — BP 112/68 | HR 88 | Temp 97.7°F | Resp 16 | Ht 66.0 in | Wt 126.5 lb

## 2021-10-17 DIAGNOSIS — Z0000 Encounter for general adult medical examination without abnormal findings: Secondary | ICD-10-CM | POA: Diagnosis not present

## 2021-10-17 NOTE — Assessment & Plan Note (Signed)
UTD on dental ?UTD on vision, new glasses for distance ?UTD on derm ? ?Things to do to keep yourself healthy  ?- Exercise at least 30-45 minutes a day, 3-4 days a week.  ?- Eat a low-fat diet with lots of fruits and vegetables, up to 7-9 servings per day.  ?- Seatbelts can save your life. Wear them always.  ?- Smoke detectors on every level of your home, check batteries every year.  ?- Eye Doctor - have an eye exam every 1-2 years  ?- Safe sex - if you may be exposed to STDs, use a condom.  ?- Alcohol -  If you drink, do it moderately, less than 2 drinks per day.  ?- [place]. Choose someone to speak for you if you are not able.  ?- Depression is common in our stressful world.If you're feeling down or losing interest in things you normally enjoy, please come in for a visit.  ?- Violence - If anyone is threatening or hurting you, please call immediately. ? ? ?

## 2021-10-18 LAB — CBC WITH DIFFERENTIAL/PLATELET
Basophils Absolute: 0 10*3/uL (ref 0.0–0.2)
Basos: 1 %
EOS (ABSOLUTE): 0 10*3/uL (ref 0.0–0.4)
Eos: 1 %
Hematocrit: 36.7 % (ref 34.0–46.6)
Hemoglobin: 12.6 g/dL (ref 11.1–15.9)
Immature Grans (Abs): 0 10*3/uL (ref 0.0–0.1)
Immature Granulocytes: 0 %
Lymphocytes Absolute: 1.4 10*3/uL (ref 0.7–3.1)
Lymphs: 41 %
MCH: 30.4 pg (ref 26.6–33.0)
MCHC: 34.3 g/dL (ref 31.5–35.7)
MCV: 88 fL (ref 79–97)
Monocytes Absolute: 0.2 10*3/uL (ref 0.1–0.9)
Monocytes: 6 %
Neutrophils Absolute: 1.7 10*3/uL (ref 1.4–7.0)
Neutrophils: 51 %
Platelets: 258 10*3/uL (ref 150–450)
RBC: 4.15 x10E6/uL (ref 3.77–5.28)
RDW: 12.9 % (ref 11.7–15.4)
WBC: 3.3 10*3/uL — ABNORMAL LOW (ref 3.4–10.8)

## 2021-10-18 LAB — LIPID PANEL
Chol/HDL Ratio: 3.4 ratio (ref 0.0–4.4)
Cholesterol, Total: 229 mg/dL — ABNORMAL HIGH (ref 100–199)
HDL: 68 mg/dL (ref >39)
LDL Chol Calc (NIH): 118 mg/dL — ABNORMAL HIGH (ref 0–99)
Triglycerides: 250 mg/dL — ABNORMAL HIGH (ref 0–149)
VLDL Cholesterol Cal: 43 mg/dL — ABNORMAL HIGH (ref 5–40)

## 2021-10-18 LAB — COMPREHENSIVE METABOLIC PANEL
ALT: 15 IU/L (ref 0–32)
AST: 22 IU/L (ref 0–40)
Albumin/Globulin Ratio: 2.4 — ABNORMAL HIGH (ref 1.2–2.2)
Albumin: 4.7 g/dL (ref 3.8–4.8)
Alkaline Phosphatase: 58 IU/L (ref 44–121)
BUN/Creatinine Ratio: 21 (ref 9–23)
BUN: 17 mg/dL (ref 6–24)
Bilirubin Total: 0.2 mg/dL (ref 0.0–1.2)
CO2: 26 mmol/L (ref 20–29)
Calcium: 9.2 mg/dL (ref 8.7–10.2)
Chloride: 99 mmol/L (ref 96–106)
Creatinine, Ser: 0.81 mg/dL (ref 0.57–1.00)
Globulin, Total: 2 g/dL (ref 1.5–4.5)
Glucose: 91 mg/dL (ref 70–99)
Potassium: 4.3 mmol/L (ref 3.5–5.2)
Sodium: 140 mmol/L (ref 134–144)
Total Protein: 6.7 g/dL (ref 6.0–8.5)
eGFR: 89 mL/min/{1.73_m2} (ref >59)

## 2021-10-18 LAB — TSH+FREE T4
Free T4: 0.89 ng/dL (ref 0.82–1.77)
TSH: 0.964 u[IU]/mL (ref 0.450–4.500)

## 2021-10-21 ENCOUNTER — Other Ambulatory Visit: Payer: Self-pay | Admitting: Family Medicine

## 2021-10-21 DIAGNOSIS — F339 Major depressive disorder, recurrent, unspecified: Secondary | ICD-10-CM

## 2021-10-22 ENCOUNTER — Ambulatory Visit
Admission: RE | Admit: 2021-10-22 | Discharge: 2021-10-22 | Disposition: A | Discharge status: Home / Self Care | Payer: BC Managed Care – PPO | Source: Ambulatory Visit | Attending: Family Medicine | Admitting: Family Medicine

## 2021-10-22 DIAGNOSIS — Z1231 Encounter for screening mammogram for malignant neoplasm of breast: Secondary | ICD-10-CM | POA: Insufficient documentation

## 2021-10-22 NOTE — Telephone Encounter (Signed)
Requested Prescriptions  ?Pending Prescriptions Disp Refills  ?? venlafaxine XR (EFFEXOR-XR) 75 MG 24 hr capsule [Pharmacy Med Name: VENLAFAXINE HCL ER 75 MG CAP] 90 capsule 1  ?  Sig: TAKE 1 CAPSULE BY MOUTH EVERY DAY WITH BREAKFAST  ?  ? Psychiatry: Antidepressants - SNRI - desvenlafaxine & venlafaxine Failed - 10/21/2021  1:13 PM  ?  ?  Failed - Lipid Panel in normal range within the last 12 months  ?  Cholesterol, Total  ?Date Value Ref Range Status  ?10/17/2021 229 (H) 100 - 199 mg/dL Final  ? ?LDL Chol Calc (NIH)  ?Date Value Ref Range Status  ?10/17/2021 118 (H) 0 - 99 mg/dL Final  ? ?HDL  ?Date Value Ref Range Status  ?10/17/2021 68 >39 mg/dL Final  ? ?Triglycerides  ?Date Value Ref Range Status  ?10/17/2021 250 (H) 0 - 149 mg/dL Final  ? ?  ?  ?  Passed - Cr in normal range and within 360 days  ?  Creatinine  ?Date Value Ref Range Status  ?08/02/2011 0.80 0.60 - 1.30 mg/dL Final  ? ?Creatinine, Ser  ?Date Value Ref Range Status  ?10/17/2021 0.81 0.57 - 1.00 mg/dL Final  ?   ?  ?  Passed - Completed PHQ-2 or PHQ-9 in the last 360 days  ?  ?  Passed - Last BP in normal range  ?  BP Readings from Last 1 Encounters:  ?10/17/21 112/68  ?   ?  ?  Passed - Valid encounter within last 6 months  ?  Recent Outpatient Visits   ?      ? 5 days ago Annual physical exam  ? [facility] Tally Joe T, FNP  ? 3 months ago Episode of recurrent major depressive disorder, unspecified depression episode severity ([place])  ? [facility] Tally Joe T, FNP  ? 4 months ago Episode of recurrent major depressive disorder, unspecified depression episode severity ([place])  ? [facility] Gwyneth Sprout, FNP  ? 7 months ago Episode of recurrent major depressive disorder, unspecified depression episode severity ([place])  ? [facility] Tally Joe T, FNP  ? 1 year ago Acute conjunctivitis of right eye, unspecified acute conjunctivitis type  ? [facility] [place], [place]  [place], [place]  ?  ?  ?Future Appointments   ?        ? In 12 months Gwyneth Sprout, [place], [facility]  ?  ? ?  ?  ?  ? ?

## 2021-11-05 ENCOUNTER — Ambulatory Visit: Payer: BC Managed Care – PPO | Admitting: Radiology

## 2021-11-05 ENCOUNTER — Encounter: Payer: Self-pay | Admitting: Radiology

## 2021-11-05 VITALS — BP 118/72 | HR 80 | Ht 65.5 in | Wt 128.0 lb

## 2021-11-05 DIAGNOSIS — Z01419 Encounter for gynecological examination (general) (routine) without abnormal findings: Secondary | ICD-10-CM | POA: Diagnosis not present

## 2021-11-05 DIAGNOSIS — E8940 Asymptomatic postprocedural ovarian failure: Secondary | ICD-10-CM

## 2021-11-05 DIAGNOSIS — Z79890 Hormone replacement therapy (postmenopausal): Secondary | ICD-10-CM | POA: Diagnosis not present

## 2021-11-05 DIAGNOSIS — Z13820 Encounter for screening for osteoporosis: Secondary | ICD-10-CM | POA: Diagnosis not present

## 2021-11-05 MED ORDER — PROGESTERONE MICRONIZED 100 MG PO CAPS: 100.0000 mg | ORAL_CAPSULE | Freq: Every day | ORAL | 4 refills | Status: DC

## 2021-11-05 MED ORDER — ESTRADIOL 0.05 MG/24HR TD PTTW: 1.0000 | MEDICATED_PATCH | TRANSDERMAL | 4 refills | Status: DC

## 2021-11-05 NOTE — Progress Notes (Signed)
? ?Joanna Cervantes 1970/07/27 [id number] ? ? ?History: Postmenopausal 51 y.o. presents for annual exam as a new patient. Her previous GYN, Dr Kenton Kingfisher, just retired. Hysterectomy (supracervical) with BSO at age 11 for endometriosis. Does have a hx of abnormal paps, none recently. She is on an estrogen patch currently along with compounded progesterone, she is still having trouble sleeping and has to take trazodone.  ? ? ?Gynecologic History ?Postmenopausal ?Last Pap: 09/16/19. Results were: normal, hx of abnormals ?Last mammogram: 10/22/21. Results were: normal ?Last colonoscopy: cologuard 2022 ?HRT use: combines ? ?Obstetric History ?OB History  ?Gravida Para Term Preterm AB Living  ?1 0     1    ?SAB IAB Ectopic Multiple Live Births  ?           ?  ?# Outcome Date GA Lbr Len/2nd Weight Sex Delivery Anes PTL Lv  ?1 AB           ? ? ? ?The following portions of the patient's history were reviewed and updated as appropriate: allergies, current medications, past family history, past medical history, past social history, past surgical history, and problem list. ? ?Review of Systems ?Pertinent items noted in HPI and remainder of comprehensive ROS otherwise negative.  ?Past medical history, past surgical history, family history and social history were all reviewed and documented in the EPIC chart. ? ?Exam: ? ?Vitals:  ? 11/05/21 0931  ?BP: 118/72  ?Pulse: 80  ?SpO2: 98%  ?Weight: 128 lb (58.1 kg)  ?Height: 5' 5.5" (1.664 m)  ? ?Body mass index is 20.98 kg/m?. ? ?General appearance:  Normal ?Thyroid:  Symmetrical, normal in size, without palpable masses or nodularity. ?Respiratory ? Auscultation:  Clear without wheezing or rhonchi ?Cardiovascular ? Auscultation:  Regular rate, without rubs, murmurs or gallops ? Edema/varicosities:  Not grossly evident ?Abdominal ? Soft,nontender, without masses, guarding or rebound. ? Liver/spleen:  No organomegaly noted ? Hernia:  None appreciated ? Skin ? Inspection:  Grossly  normal ?Breasts: Examined lying and sitting. Scarring present from reduction ? Right: Without masses, retractions, nipple discharge or axillary adenopathy. ? ? Left: Without masses, retractions, nipple discharge or axillary adenopathy. ?Genitourinary  ? Inguinal/mons:  Normal without inguinal adenopathy ? External genitalia:  Normal appearing vulva with no masses, tenderness, or lesions ? BUS/Urethra/Skene's glands:  Normal ? Vagina:  Normal appearing with normal color and discharge, no lesions. Atrophy: mild  ? Cervix:  Normal appearing without discharge or lesions ? Uterus:  absent ? Adnexa/parametria:  absent ? Anus and perineum: Normal ?  ? ?Patient informed chaperone available to be present for breast and pelvic exam. Patient has requested no chaperone to be present. Patient has been advised what will be completed during breast and pelvic exam.  ? ?Assessment/Plan:   ?1. Well woman exam with routine gynecological exam ?Pap due 2024 ?Has PCP ?Cologuard up to date ? ?2. 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  Dispense: 24 patch; Refill: 4 ?- progesterone (PROMETRIUM) 100 MG capsule; Take 1 capsule (100 mg total) by mouth daily.  Dispense: 90 capsule; Refill: 4 ? ?3. Screening for osteoporosis ?Early menopause ?- DG Bone Density; Future ?  ? ?Discussed SBE, colonoscopy and DEXA screening as directed. Recommend 134mns of exercise weekly, including weight bearing exercise. Encouraged the use of seatbelts and sunscreen.  ?Return in 1 year  for annual or sooner prn. ? ?Kerry Dory WHNP-BC, 9:57 AM 11/05/2021  ?

## 2021-11-06 ENCOUNTER — Encounter: Payer: Self-pay | Admitting: Family Medicine

## 2021-12-24 ENCOUNTER — Other Ambulatory Visit: Payer: Self-pay | Admitting: Family Medicine

## 2021-12-24 ENCOUNTER — Encounter: Payer: Self-pay | Admitting: Family Medicine

## 2021-12-24 DIAGNOSIS — R1011 Right upper quadrant pain: Secondary | ICD-10-CM

## 2021-12-25 ENCOUNTER — Ambulatory Visit
Admission: RE | Admit: 2021-12-25 | Discharge: 2021-12-25 | Disposition: A | Discharge status: Home / Self Care | Payer: BC Managed Care – PPO | Source: Ambulatory Visit | Attending: Family Medicine | Admitting: Family Medicine

## 2021-12-25 DIAGNOSIS — R1011 Right upper quadrant pain: Secondary | ICD-10-CM | POA: Diagnosis present

## 2022-01-07 ENCOUNTER — Other Ambulatory Visit: Payer: Self-pay | Admitting: Family Medicine

## 2022-01-07 DIAGNOSIS — F339 Major depressive disorder, recurrent, unspecified: Secondary | ICD-10-CM

## 2022-01-20 ENCOUNTER — Other Ambulatory Visit: Payer: Self-pay | Admitting: Family Medicine

## 2022-01-20 DIAGNOSIS — F5104 Psychophysiologic insomnia: Secondary | ICD-10-CM

## 2022-05-26 ENCOUNTER — Encounter: Payer: Self-pay | Admitting: Family Medicine

## 2022-05-26 ENCOUNTER — Ambulatory Visit: Payer: BC Managed Care – PPO | Admitting: Family Medicine

## 2022-05-26 VITALS — BP 112/58 | HR 79 | Resp 16 | Wt 128.0 lb

## 2022-05-26 DIAGNOSIS — J019 Acute sinusitis, unspecified: Secondary | ICD-10-CM

## 2022-05-26 DIAGNOSIS — R058 Other specified cough: Secondary | ICD-10-CM | POA: Diagnosis not present

## 2022-05-26 NOTE — Progress Notes (Signed)
   SUBJECTIVE:   CHIEF COMPLAINT / HPI:   UPPER RESPIRATORY TRACT INFECTION - seen by UC 11/3 for acute bacterial sinusitis, bronchitis, given augmentin, prednisone, mucinex. Strep, RSV, flu, COVID negative. - symptom onset 10/29.  - symptoms improved, still with nonproductive cough, R ear drainage. Some runny nose.  - completed 7 days of augmentin, prednisone.  - denies fever, chest pain, SOB, N/V - Treatments attempted: cold/sinus, mucinex, pseudoephedrine, cough syrup, and antibiotics    OBJECTIVE:   BP (!) 112/58 (BP Location: Right Arm, Patient Position: Sitting, Cuff Size: Normal)   Pulse 79   Resp 16   Wt 128 lb (58.1 kg)   SpO2 100%   BMI 20.98 kg/m   Gen: well appearing, in NAD HEENT: orophyarynx clear without exudate or erythema. Uvula midline. No tonsillar enlargement. Good dentition. TM visible b/l without bulging, erythema, purulence. Slight air fluid level seen. Maxillary and frontal sinuses nonTTP. Card: RRR Lungs: CTAB Ext: WWP, no edema   ASSESSMENT/PLAN:   Sinusitis Improved s/p steroids, abx. Still with likely post-viral cough, some drainage though no evidence of infection on exam today. Flu, COVID, RSV, strep negative. Recommend continued supportive care and antihistamine prn for post-viral cough.    Caro Laroche, DO

## 2022-06-03 ENCOUNTER — Ambulatory Visit (INDEPENDENT_AMBULATORY_CARE_PROVIDER_SITE_OTHER): Payer: BC Managed Care – PPO

## 2022-06-03 ENCOUNTER — Other Ambulatory Visit: Payer: Self-pay | Admitting: Radiology

## 2022-06-03 DIAGNOSIS — Z13820 Encounter for screening for osteoporosis: Secondary | ICD-10-CM | POA: Diagnosis not present

## 2022-06-03 DIAGNOSIS — Z01419 Encounter for gynecological examination (general) (routine) without abnormal findings: Secondary | ICD-10-CM

## 2022-06-03 DIAGNOSIS — M85852 Other specified disorders of bone density and structure, left thigh: Secondary | ICD-10-CM

## 2022-06-03 DIAGNOSIS — Z780 Asymptomatic menopausal state: Secondary | ICD-10-CM | POA: Diagnosis not present

## 2022-06-03 DIAGNOSIS — E8940 Asymptomatic postprocedural ovarian failure: Secondary | ICD-10-CM

## 2022-06-30 ENCOUNTER — Other Ambulatory Visit: Payer: Self-pay | Admitting: Family Medicine

## 2022-06-30 DIAGNOSIS — F339 Major depressive disorder, recurrent, unspecified: Secondary | ICD-10-CM

## 2022-07-23 ENCOUNTER — Other Ambulatory Visit: Payer: Self-pay | Admitting: Family Medicine

## 2022-07-23 DIAGNOSIS — F5104 Psychophysiologic insomnia: Secondary | ICD-10-CM

## 2022-07-23 NOTE — Telephone Encounter (Signed)
Requested Prescriptions  Pending Prescriptions Disp Refills   traZODone (DESYREL) 50 MG tablet [Pharmacy Med Name: TRAZODONE HCL 50 MG TAB] 90 tablet 0    Sig: TAKE ONE TABLET AT BEDTIME IF NEEDED FORSLEEP     Psychiatry: Antidepressants - Serotonin Modulator Failed - 07/23/2022 10:02 AM      Failed - Valid encounter within last 6 months    Recent Outpatient Visits           1 month ago Acute non-recurrent sinusitis, unspecified location   [place], DO   9 months ago Annual physical exam   [facility] Tally Joe T, [place]   1 year ago Episode of recurrent major depressive disorder, unspecified depression episode severity [facility])   [facility] Tally Joe T, FNP   1 year ago Episode of recurrent major depressive disorder, unspecified depression episode severity [facility])   [facility] Tally Joe T, FNP   1 year ago Episode of recurrent major depressive disorder, unspecified depression episode severity [facility])   [facility] Gwyneth Sprout, FNP       Future Appointments             In 3 months Gwyneth Sprout, [place], [facility]            Passed - Completed PHQ-2 or PHQ-9 in the last 360 days

## 2022-08-14 HISTORY — PX: REFRACTIVE SURGERY: SHX103

## 2022-09-12 ENCOUNTER — Other Ambulatory Visit: Payer: Self-pay | Admitting: Radiology

## 2022-09-12 DIAGNOSIS — Z1231 Encounter for screening mammogram for malignant neoplasm of breast: Secondary | ICD-10-CM

## 2022-10-20 ENCOUNTER — Encounter: Payer: Self-pay | Admitting: Family Medicine

## 2022-10-20 NOTE — Progress Notes (Signed)
I,J'ya E Hunter,acting as a scribe for Jacky Kindle, FNP.,have documented all relevant documentation on the behalf of Jacky Kindle, FNP,as directed by  Jacky Kindle, FNP while in the presence of Jacky Kindle, FNP.   Complete physical exam   Patient: Joanna Cervantes   DOB: 1970/08/27   52 y.o. Female  MRN: [id number] Visit Date: 10/21/2022  Today's healthcare provider: Jacky Kindle, FNP  Re Introduced to nurse practitioner role and practice setting.  All questions answered.  Discussed provider/patient relationship and expectations.  Chief Complaint  Patient presents with   Sinus Problem   Annual Exam   Subjective    Joanna Cervantes is a 53 y.o. female who presents today for a complete physical exam.  She reports consuming a general diet. Gym/ health club routine includes cardio. She generally feels fairly well. She reports sleeping fairly well. She does have additional problems to discuss today.  Sinus Problem This is a new problem. Episode onset: Friday (4/5) The problem has been gradually improving since onset. There has been no fever. Associated symptoms include congestion, coughing, headaches, a hoarse voice, sinus pressure, sneezing, a sore throat and swollen glands (right). Pertinent negatives include no shortness of breath. Past treatments include acetaminophen, saline nose sprays and oral decongestants. The treatment provided moderate relief.   She reports that she had Bronchitis in November and head cold in February. Mammogram upcoming Friday.   Past Medical History:  Diagnosis Date   Abnormal Pap smear of cervix    Bulging of cervical intervertebral disc    sees chiropractor for alignment monthly   Depression    Endometriosis    s/p partial hysterectomy 2013   Family history of adverse reaction to anesthesia    Father - PONV   History of chicken pox    Irregular menses    Migraine headache    4-6x/yr   Motion sickness    back seat of car   PONV  (postoperative nausea and vomiting)    severe   Vertigo    3 episodes.  last one approx 2 months ago   Past Surgical History:  Procedure Laterality Date   ABDOMINAL HYSTERECTOMY  11/2011   not due to cancer; Endometriosis and Ovarian cyst. Still has cervix   APPENDECTOMY  08/13/2011   Diagnostic Laproscopy, prophylactic appendectomy, and drainage of right ovarian cyst done by Dr. Lemar Livings at [facility]   BILATERAL SALPINGOOPHORECTOMY     BREAST ENHANCEMENT SURGERY  2000   reduction - DR. MORRISON   CERVIX LESION DESTRUCTION     COLPOSCOPY  02/20/2003   ENDOMETRIAL ABLATION  09/10/2006   BVD   HYSTEROSCOPY  09/10/2006   D&C; WITH NOVASURE AND TUBAL LIGATION; BVD   LAPAROSCOPIC SUPRACERVICAL HYSTERECTOMY     LARYNGOSCOPY  11/03/2006   DR MCQUEEN, SMALL CYSTIC AREA OF THE RIGHT ANTERIOR VOCAL FOLD.   LEEP  2004   MM BREAST STEREO BIOPSY LEFT ([facility] HX)  08/13/2009   REDUCTION MAMMAPLASTY Bilateral 2000   SHOULDER ARTHROSCOPY Right 05/06/2017   Procedure: Limited arthroscopic debridement, arthroscopic subacromial decompression, mini-open rotator cuff repair, and mini-open biceps tenodesis, right shoulder;  Surgeon: Christena Flake, MD;  Location: [facility];  Service: Orthopedics;  Laterality: Right;   TUBAL LIGATION  2008   Laporoscopic tubal ligation by cautery   TUBAL LIGATION  09/10/2006   BVD   Social History   Socioeconomic History   Marital status: Married    Spouse name: Not  on file   Number of children: 0   Years of education: Not on file   Highest education level: Not on file  Occupational History   Not on file  Tobacco Use   Smoking status: Former    Packs/day: .5    Types: Cigarettes    Quit date: 07/15/2007    Years since quitting: 15.2   Smokeless tobacco: Never  Vaping Use   Vaping Use: Never used  Substance and Sexual Activity   Alcohol use: Yes    Alcohol/week: 4.0 standard drinks of alcohol    Types: 4 Glasses of wine per week    Comment: occasional  use   Drug use: No   Sexual activity: Yes    Partners: Male    Birth control/protection: None, Surgical  Other Topics Concern   Not on file  Social History Narrative   Not on file   Social Determinants of Health   Financial Resource Strain: Not on file  Food Insecurity: Not on file  Transportation Needs: Not on file  Physical Activity: Not on file  Stress: Not on file  Social Connections: Not on file  Intimate Partner Violence: Not on file   Family Status  Relation Name Status   Mother  Alive   Father  Alive   Sister  Alive   Brother  Alive   MGF  Deceased   PGF  Deceased   Neg Hx  (Not Specified)   Family History  Problem Relation Age of Onset   Hypertension Mother    Hypertension Father    Diabetes Brother    Heart attack Maternal Grandfather    Parkinson's disease Maternal Grandfather    Cancer Maternal Grandfather        PROSTATE   Heart attack Paternal Grandfather    Lung cancer Paternal Grandfather    Breast cancer Neg Hx    Allergies  Allergen Reactions   Mushroom Extract Complex Swelling    throat   Buspirone Other (See Comments)    Vertigo, dizziness   Aspirin Hives   Celecoxib Diarrhea   Bupropion Rash and Swelling    Patient Care Team: Jacky Kindle, FNP as PCP - General (Family Medicine) Arvil Chaco, Belia Heman, MD (Unknown Physician Specialty) Elita Quick   Medications: Outpatient Medications Prior to Visit  Medication Sig   Cyanocobalamin (B-12 PO) Take by mouth.   estradiol (DOTTI) 0.05 MG/24HR patch Place 1 patch (0.05 mg total) onto the skin 2 (two) times a week.   ibuprofen (ADVIL,MOTRIN) 200 MG tablet Take 400 mg by mouth every 6 (six) hours as needed.   MAGNESIUM PO Take by mouth.   progesterone (PROMETRIUM) 100 MG capsule Take 1 capsule (100 mg total) by mouth daily.   Pyridoxine HCl (VITAMIN B-6 PO) Take by mouth.   VITAMIN D PO Take by mouth.   [DISCONTINUED] traZODone (DESYREL) 50 MG tablet TAKE ONE TABLET AT BEDTIME IF NEEDED  FORSLEEP   [DISCONTINUED] venlafaxine XR (EFFEXOR-XR) 150 MG 24 hr capsule TAKE 1 CAPSULE BY MOUTH DAILY WITH BREAKFAST   [DISCONTINUED] venlafaxine XR (EFFEXOR-XR) 75 MG 24 hr capsule TAKE 1 CAPSULE BY MOUTH EVERY DAY WITH BREAKFAST   No facility-administered medications prior to visit.    Review of Systems  HENT:  Positive for congestion, hoarse voice, rhinorrhea, sinus pressure, sneezing and sore throat.   Respiratory:  Positive for cough. Negative for shortness of breath.   Allergic/Immunologic: Positive for environmental allergies.  Neurological:  Positive for headaches. Negative for light-headedness.  Objective    BP 119/67 (BP Location: Right Arm, Patient Position: Sitting, Cuff Size: Normal)   Pulse 95   Temp 98.3 F (36.8 C) (Oral)   Resp 17   Ht 5\' 6"  (1.676 m)   Wt 129 lb 6.4 oz (58.7 kg)   SpO2 100%   BMI 20.89 kg/m   Physical Exam Vitals and nursing note reviewed.  Constitutional:      General: She is awake. She is not in acute distress.    Appearance: Normal appearance. She is well-developed, well-groomed and normal weight. She is not ill-appearing, toxic-appearing or diaphoretic.  HENT:     Head: Normocephalic and atraumatic.     Jaw: There is normal jaw occlusion. No trismus, tenderness, swelling or pain on movement.     Right Ear: Hearing, tympanic membrane, ear canal and external ear normal. There is no impacted cerumen.     Left Ear: Hearing, tympanic membrane, ear canal and external ear normal. There is no impacted cerumen.     Nose: Nose normal. No congestion or rhinorrhea.     Right Turbinates: Not enlarged, swollen or pale.     Left Turbinates: Not enlarged, swollen or pale.     Right Sinus: No maxillary sinus tenderness or frontal sinus tenderness.     Left Sinus: No maxillary sinus tenderness or frontal sinus tenderness.     Mouth/Throat:     Lips: Pink.     Mouth: Mucous membranes are moist. No injury.     Tongue: No lesions.     Pharynx:  Oropharynx is clear. Uvula midline. No pharyngeal swelling, oropharyngeal exudate, posterior oropharyngeal erythema or uvula swelling.     Tonsils: No tonsillar exudate or tonsillar abscesses.  Eyes:     General: Lids are normal. Lids are everted, no foreign bodies appreciated. Vision grossly intact. Gaze aligned appropriately. No allergic shiner or visual field deficit.       Right eye: No discharge.        Left eye: No discharge.     Extraocular Movements: Extraocular movements intact.     Conjunctiva/sclera: Conjunctivae normal.     Right eye: Right conjunctiva is not injected. No exudate.    Left eye: Left conjunctiva is not injected. No exudate.    Pupils: Pupils are equal, round, and reactive to light.  Neck:     Thyroid: No thyroid mass, thyromegaly or thyroid tenderness.     Vascular: No carotid bruit.     Trachea: Trachea normal.  Cardiovascular:     Rate and Rhythm: Normal rate and regular rhythm.     Pulses: Normal pulses.          Carotid pulses are 2+ on the right side and 2+ on the left side.      Radial pulses are 2+ on the right side and 2+ on the left side.       Dorsalis pedis pulses are 2+ on the right side and 2+ on the left side.       Posterior tibial pulses are 2+ on the right side and 2+ on the left side.     Heart sounds: Normal heart sounds, S1 normal and S2 normal. No murmur heard.    No friction rub. No gallop.  Pulmonary:     Effort: Pulmonary effort is normal. No respiratory distress.     Breath sounds: Normal air entry. Transmitted upper airway sounds present. No stridor. Decreased breath sounds and wheezing present. No rhonchi or rales.  Chest:  Chest wall: No tenderness.  Abdominal:     General: Abdomen is flat. Bowel sounds are normal. There is no distension.     Palpations: Abdomen is soft. There is no mass.     Tenderness: There is no abdominal tenderness. There is no right CVA tenderness, left CVA tenderness, guarding or rebound.     Hernia: No  hernia is present.  Genitourinary:    Comments: Exam deferred; denies complaints Musculoskeletal:        General: No swelling, tenderness, deformity or signs of injury. Normal range of motion.     Cervical back: Full passive range of motion without pain, normal range of motion and neck supple. No edema, rigidity or tenderness. No muscular tenderness.     Right lower leg: No edema.     Left lower leg: No edema.  Lymphadenopathy:     Cervical: No cervical adenopathy.     Right cervical: No superficial, deep or posterior cervical adenopathy.    Left cervical: No superficial, deep or posterior cervical adenopathy.  Skin:    General: Skin is warm and dry.     Capillary Refill: Capillary refill takes less than 2 seconds.     Coloration: Skin is not jaundiced or pale.     Findings: No bruising, erythema, lesion or rash.  Neurological:     General: No focal deficit present.     Mental Status: She is alert and oriented to person, place, and time. Mental status is at baseline.     GCS: GCS eye subscore is 4. GCS verbal subscore is 5. GCS motor subscore is 6.     Sensory: Sensation is intact. No sensory deficit.     Motor: Motor function is intact. No weakness.     Coordination: Coordination is intact. Coordination normal.     Gait: Gait is intact. Gait normal.  Psychiatric:        Attention and Perception: Attention and perception normal.        Mood and Affect: Mood and affect normal.        Speech: Speech normal.        Behavior: Behavior normal. Behavior is cooperative.        Thought Content: Thought content normal.        Cognition and Memory: Cognition and memory normal.        Judgment: Judgment normal.     Last depression screening scores    10/21/2022    9:05 AM 10/17/2021    1:48 PM 07/19/2021   10:13 AM  PHQ 2/9 Scores  PHQ - 2 Score 0 1 0  PHQ- 9 Score 1 3 3    Last fall risk screening    10/21/2022    9:05 AM  Fall Risk   Falls in the past year? 0  Number falls in past  yr: 0  Injury with Fall? 0  Risk for fall due to : No Fall Risks   Last Audit-C alcohol use screening    10/21/2022    9:06 AM  Alcohol Use Disorder Test (AUDIT)  1. How often do you have a drink containing alcohol? 2  2. How many drinks containing alcohol do you have on a typical day when you are drinking? 0  3. How often do you have six or more drinks on one occasion? 0  AUDIT-C Score 2   A score of 3 or more in women, and 4 or more in men indicates increased risk for alcohol abuse, EXCEPT if all  of the points are from question 1   Results for orders placed or performed in visit on 10/21/22  CBC with Differential/Platelet  Result Value Ref Range   WBC 3.3 (L) 3.4 - 10.8 x10E3/uL   RBC 4.16 3.77 - 5.28 x10E6/uL   Hemoglobin 12.2 11.1 - 15.9 g/dL   Hematocrit 47.8 29.5 - 46.6 %   MCV 90 79 - 97 fL   MCH 29.3 26.6 - 33.0 pg   MCHC 32.7 31.5 - 35.7 g/dL   RDW 62.1 30.8 - 65.7 %   Platelets 195 150 - 450 x10E3/uL   Neutrophils 66 Not Estab. %   Lymphs 27 Not Estab. %   Monocytes 6 Not Estab. %   Eos 0 Not Estab. %   Basos 1 Not Estab. %   Neutrophils Absolute 2.2 1.4 - 7.0 x10E3/uL   Lymphocytes Absolute 0.9 0.7 - 3.1 x10E3/uL   Monocytes Absolute 0.2 0.1 - 0.9 x10E3/uL   EOS (ABSOLUTE) 0.0 0.0 - 0.4 x10E3/uL   Basophils Absolute 0.0 0.0 - 0.2 x10E3/uL   Immature Granulocytes 0 Not Estab. %   Immature Grans (Abs) 0.0 0.0 - 0.1 x10E3/uL  Comprehensive Metabolic Panel (CMET)  Result Value Ref Range   Glucose 93 70 - 99 mg/dL   BUN 13 6 - 24 mg/dL   Creatinine, Ser 8.46 0.57 - 1.00 mg/dL   eGFR 92 >96 EX/BMW/4.13   BUN/Creatinine Ratio 17 9 - 23   Sodium 137 134 - 144 mmol/L   Potassium 4.6 3.5 - 5.2 mmol/L   Chloride 99 96 - 106 mmol/L   CO2 23 20 - 29 mmol/L   Calcium 8.8 8.7 - 10.2 mg/dL   Total Protein 6.6 6.0 - 8.5 g/dL   Albumin 4.5 3.9 - 4.9 g/dL   Globulin, Total 2.1 1.5 - 4.5 g/dL   Albumin/Globulin Ratio 2.1 1.2 - 2.2   Bilirubin Total 0.2 0.0 - 1.2 mg/dL    Alkaline Phosphatase 78 44 - 121 IU/L   AST 25 0 - 40 IU/L   ALT 19 0 - 32 IU/L  Lipid panel  Result Value Ref Range   Cholesterol, Total 209 (H) 100 - 199 mg/dL   Triglycerides 244 (H) 0 - 149 mg/dL   HDL 67 >01 mg/dL   VLDL Cholesterol Cal 26 5 - 40 mg/dL   LDL Chol Calc (NIH) 027 (H) 0 - 99 mg/dL   Chol/HDL Ratio 3.1 0.0 - 4.4 ratio    Assessment & Plan    Routine Health Maintenance and Physical Exam  Exercise Activities and Dietary recommendations  Goals   None     Immunization History  Administered Date(s) Administered   Influenza,inj,Quad PF,6+ Mos 04/16/2018, 04/27/2019, 06/05/2021   Influenza-Unspecified 04/14/2015, 05/06/2015, 04/23/2016   PFIZER(Purple Top)SARS-COV-2 Vaccination 11/10/2019, 02/13/2020, 12/27/2020   Tdap 09/11/2009, 03/01/2021    Health Maintenance  Topic Date Due   COVID-19 Vaccine (4 - 2023-24 season) 03/14/2022   Zoster Vaccines- Shingrix (1 of 2) Never done   INFLUENZA VACCINE  02/12/2023   MAMMOGRAM  10/23/2023   PAP SMEAR-Modifier  09/15/2024   COLONOSCOPY (Pts 45-98yrs Insurance coverage will need to be confirmed)  10/20/2030   DTaP/Tdap/Td (3 - Td or Tdap) 03/02/2031   Hepatitis C Screening  Completed   HIV Screening  Completed   HPV VACCINES  Aged Out    Discussed health benefits of physical activity, and encouraged her to engage in regular exercise appropriate for her age and condition.  Return if symptoms worsen  or fail to improve.    Leilani MerlI, Byron Tipping T Sir Mallis, FNP, have reviewed all documentation for this visit. The documentation on 10/24/22 for the exam, diagnosis, procedures, and orders are all accurate and complete.  Jacky KindleElise T Konrad Hoak, FNP  [facility] [phone] (phone) [phone] (fax)  [facility]

## 2022-10-21 ENCOUNTER — Ambulatory Visit (INDEPENDENT_AMBULATORY_CARE_PROVIDER_SITE_OTHER): Payer: BC Managed Care – PPO | Admitting: Family Medicine

## 2022-10-21 ENCOUNTER — Encounter: Payer: Self-pay | Admitting: Family Medicine

## 2022-10-21 VITALS — BP 119/67 | HR 95 | Temp 98.3°F | Resp 17 | Ht 66.0 in | Wt 129.4 lb

## 2022-10-21 DIAGNOSIS — Z1231 Encounter for screening mammogram for malignant neoplasm of breast: Secondary | ICD-10-CM

## 2022-10-21 DIAGNOSIS — Z0000 Encounter for general adult medical examination without abnormal findings: Secondary | ICD-10-CM | POA: Diagnosis not present

## 2022-10-21 DIAGNOSIS — J3489 Other specified disorders of nose and nasal sinuses: Secondary | ICD-10-CM

## 2022-10-21 DIAGNOSIS — J40 Bronchitis, not specified as acute or chronic: Secondary | ICD-10-CM

## 2022-10-21 DIAGNOSIS — F5104 Psychophysiologic insomnia: Secondary | ICD-10-CM

## 2022-10-21 DIAGNOSIS — F339 Major depressive disorder, recurrent, unspecified: Secondary | ICD-10-CM

## 2022-10-21 DIAGNOSIS — R0981 Nasal congestion: Secondary | ICD-10-CM

## 2022-10-21 MED ORDER — IPRATROPIUM BROMIDE 0.03 % NA SOLN
2.0000 | Freq: Two times a day (BID) | NASAL | 12 refills | Status: DC
Start: 2022-10-21 — End: 2023-11-24

## 2022-10-21 MED ORDER — VENLAFAXINE HCL ER 150 MG PO CP24: 150.0000 mg | ORAL_CAPSULE | Freq: Every day | ORAL | 3 refills | Status: DC

## 2022-10-21 MED ORDER — TRAZODONE HCL 50 MG PO TABS: 50.0000 mg | ORAL_TABLET | Freq: Every evening | ORAL | 3 refills | Status: DC | PRN

## 2022-10-21 MED ORDER — BENZONATATE 200 MG PO CAPS: 200.0000 mg | ORAL_CAPSULE | Freq: Two times a day (BID) | ORAL | 0 refills | Status: DC | PRN

## 2022-10-21 MED ORDER — MONTELUKAST SODIUM 10 MG PO TABS
10.0000 mg | ORAL_TABLET | Freq: Every day | ORAL | 3 refills | Status: DC
Start: 2022-10-21 — End: 2023-10-13

## 2022-10-21 MED ORDER — PREDNISONE 50 MG PO TABS: 50.0000 mg | ORAL_TABLET | Freq: Every day | ORAL | 0 refills | Status: DC

## 2022-10-21 MED ORDER — ALBUTEROL SULFATE HFA 108 (90 BASE) MCG/ACT IN AERS: 2.0000 | INHALATION_SPRAY | Freq: Four times a day (QID) | RESPIRATORY_TRACT | 2 refills | Status: DC | PRN

## 2022-10-21 MED ORDER — VENLAFAXINE HCL ER 75 MG PO CP24: 75.0000 mg | ORAL_CAPSULE | Freq: Every day | ORAL | 3 refills | Status: DC

## 2022-10-21 NOTE — Patient Instructions (Signed)
The CDC recommends two doses of Shingrix (the new shingles vaccine) separated by 2 to 6 months for adults age 50 years and older. I recommend checking with your insurance plan regarding coverage for this vaccine.    

## 2022-10-22 LAB — COMPREHENSIVE METABOLIC PANEL
ALT: 19 IU/L (ref 0–32)
AST: 25 IU/L (ref 0–40)
Albumin/Globulin Ratio: 2.1 (ref 1.2–2.2)
Albumin: 4.5 g/dL (ref 3.9–4.9)
Alkaline Phosphatase: 78 IU/L (ref 44–121)
BUN/Creatinine Ratio: 17 (ref 9–23)
BUN: 13 mg/dL (ref 6–24)
Bilirubin Total: 0.2 mg/dL (ref 0.0–1.2)
CO2: 23 mmol/L (ref 20–29)
Calcium: 8.8 mg/dL (ref 8.7–10.2)
Chloride: 99 mmol/L (ref 96–106)
Creatinine, Ser: 0.78 mg/dL (ref 0.57–1.00)
Globulin, Total: 2.1 g/dL (ref 1.5–4.5)
Glucose: 93 mg/dL (ref 70–99)
Potassium: 4.6 mmol/L (ref 3.5–5.2)
Sodium: 137 mmol/L (ref 134–144)
Total Protein: 6.6 g/dL (ref 6.0–8.5)
eGFR: 92 mL/min/{1.73_m2} (ref >59)

## 2022-10-22 LAB — CBC WITH DIFFERENTIAL/PLATELET
Basophils Absolute: 0 10*3/uL (ref 0.0–0.2)
Basos: 1 %
EOS (ABSOLUTE): 0 10*3/uL (ref 0.0–0.4)
Eos: 0 %
Hematocrit: 37.3 % (ref 34.0–46.6)
Hemoglobin: 12.2 g/dL (ref 11.1–15.9)
Immature Grans (Abs): 0 10*3/uL (ref 0.0–0.1)
Immature Granulocytes: 0 %
Lymphocytes Absolute: 0.9 10*3/uL (ref 0.7–3.1)
Lymphs: 27 %
MCH: 29.3 pg (ref 26.6–33.0)
MCHC: 32.7 g/dL (ref 31.5–35.7)
MCV: 90 fL (ref 79–97)
Monocytes Absolute: 0.2 10*3/uL (ref 0.1–0.9)
Monocytes: 6 %
Neutrophils Absolute: 2.2 10*3/uL (ref 1.4–7.0)
Neutrophils: 66 %
Platelets: 195 10*3/uL (ref 150–450)
RBC: 4.16 x10E6/uL (ref 3.77–5.28)
RDW: 13.2 % (ref 11.7–15.4)
WBC: 3.3 10*3/uL — ABNORMAL LOW (ref 3.4–10.8)

## 2022-10-22 LAB — LIPID PANEL
Chol/HDL Ratio: 3.1 ratio (ref 0.0–4.4)
Cholesterol, Total: 209 mg/dL — ABNORMAL HIGH (ref 100–199)
HDL: 67 mg/dL (ref >39)
LDL Chol Calc (NIH): 116 mg/dL — ABNORMAL HIGH (ref 0–99)
Triglycerides: 151 mg/dL — ABNORMAL HIGH (ref 0–149)
VLDL Cholesterol Cal: 26 mg/dL (ref 5–40)

## 2022-10-22 NOTE — Progress Notes (Signed)
Cholesterol remains elevated; however, improved fats and slight improvement in bad/LDL. I continue to recommend diet low in saturated fat and regular exercise - 30 min at least 5 times per week given low stroke and/or heart attack risk at 1% in 10 years.  The 10-year ASCVD risk score (Arnett DK, et al., 2019) is: 0.9%   Values used to calculate the score:     Age: 52 years     Sex: Female     Is Non-Hispanic African American: No     Diabetic: No     Tobacco smoker: No     Systolic Blood Pressure: 119 mmHg     Is BP treated: No     HDL Cholesterol: 67 mg/dL     Total Cholesterol: 209 mg/dL Other labs are normal and stable. Please call and schedule your mammogram if you haven't done so already.  [facility]  [address], [address] [facility] [place],  [place]  [address] Get Driving Directions Main: [phone]  Sunday:Closed Monday:7:20 AM - 5:00 PM Tuesday:7:20 AM - 5:00 PM Wednesday:7:20 AM - 5:00 PM Thursday:7:20 AM - 5:00 PM Friday:7:20 AM - 4:30 PM Saturday:Closed  Let us know if you continue to feel ill following your last appt. Take care, Jacky Kindle, FNP  [facility] [address] [place], [place] [address] [phone] (phone) [phone] (fax) [facility]

## 2022-10-24 ENCOUNTER — Ambulatory Visit
Admission: RE | Admit: 2022-10-24 | Discharge: 2022-10-24 | Disposition: A | Discharge status: Home / Self Care | Payer: BC Managed Care – PPO | Source: Ambulatory Visit | Attending: Radiology | Admitting: Radiology

## 2022-10-24 DIAGNOSIS — Z1231 Encounter for screening mammogram for malignant neoplasm of breast: Secondary | ICD-10-CM | POA: Diagnosis present

## 2022-10-24 DIAGNOSIS — J40 Bronchitis, not specified as acute or chronic: Secondary | ICD-10-CM | POA: Insufficient documentation

## 2022-10-24 NOTE — Assessment & Plan Note (Signed)
Chronic, stable Continue PRN trazodone at 50 mg qHS

## 2022-10-24 NOTE — Assessment & Plan Note (Signed)

## 2022-10-24 NOTE — Assessment & Plan Note (Signed)
Flowsheet Row Office Visit from 10/21/2022 in [facility]  PHQ-9 Total Score 1      Chronic, recurrent, major depression, in partial remission PHQ reviewed  Continued effexor at 225 mg daily

## 2022-10-24 NOTE — Assessment & Plan Note (Signed)
Due for screening for mammogram, denies breast concerns, provided with phone number to call and schedule appointment for mammogram. Encouraged to repeat breast cancer screening every 1-2 years.  

## 2022-10-24 NOTE — Assessment & Plan Note (Signed)
Acute, self limiting Without concern for infection Recommend use of OTC supportive cold symptoms in addition to both oral and inhaled steroids, use of nasal spray to assist with PND, and PRN cough suppressant [tessalon] and mucus relief [singulair] Defer xray at this time; pt with normal work of breathing; lung sounds appreciated in all fields; no acute desaturations

## 2022-11-07 ENCOUNTER — Other Ambulatory Visit (HOSPITAL_COMMUNITY)
Admission: RE | Admit: 2022-11-07 | Discharge: 2022-11-07 | Disposition: A | Discharge status: Home / Self Care | Payer: BC Managed Care – PPO | Source: Ambulatory Visit | Attending: Radiology | Admitting: Radiology

## 2022-11-07 ENCOUNTER — Ambulatory Visit (INDEPENDENT_AMBULATORY_CARE_PROVIDER_SITE_OTHER): Payer: BC Managed Care – PPO | Admitting: Radiology

## 2022-11-07 ENCOUNTER — Encounter: Payer: Self-pay | Admitting: Radiology

## 2022-11-07 VITALS — BP 122/74 | Ht 66.0 in | Wt 130.0 lb

## 2022-11-07 DIAGNOSIS — E8940 Asymptomatic postprocedural ovarian failure: Secondary | ICD-10-CM | POA: Diagnosis not present

## 2022-11-07 DIAGNOSIS — Z01419 Encounter for gynecological examination (general) (routine) without abnormal findings: Secondary | ICD-10-CM | POA: Insufficient documentation

## 2022-11-07 DIAGNOSIS — Z79890 Hormone replacement therapy (postmenopausal): Secondary | ICD-10-CM

## 2022-11-07 MED ORDER — PROGESTERONE MICRONIZED 100 MG PO CAPS: 100.0000 mg | ORAL_CAPSULE | Freq: Every day | ORAL | 4 refills | Status: DC

## 2022-11-07 MED ORDER — ESTRADIOL 0.075 MG/24HR TD PTTW
1.0000 | MEDICATED_PATCH | TRANSDERMAL | 4 refills | Status: DC
Start: 2022-11-10 — End: 2023-11-10

## 2022-11-07 NOTE — Progress Notes (Signed)
   Joanna Cervantes Dec 27, 1970 [id number]   History: Postmenopausal 52 y.o. presents for annual exam. Hysterectomy (supracervical) with BSO at age 59 for endometriosis. Does have a hx of abnormal paps, none recently. She is on an estrogen patch currently along with micronized progesterone, doing well, having some night sweats, open to increasing HRT dose.   Gynecologic History Postmenopausal Last Pap: 09/16/19. Results were: normal, hx of abnormals Last mammogram: 4/24. Results were: normal Last colonoscopy: cologuard 2022 HRT use: combines  Obstetric History OB History  Gravida Para Term Preterm AB Living  1 0     1    SAB IAB Ectopic Multiple Live Births               # Outcome Date GA Lbr Len/2nd Weight Sex Delivery Anes PTL Lv  1 AB              The following portions of the patient's history were reviewed and updated as appropriate: allergies, current medications, past family history, past medical history, past social history, past surgical history, and problem list.  Review of Systems Pertinent items noted in HPI and remainder of comprehensive ROS otherwise negative.  Past medical history, past surgical history, family history and social history were all reviewed and documented in the EPIC chart.  Exam:  There were no vitals filed for this visit.  There is no height or weight on file to calculate BMI.  General appearance:  Normal Thyroid:  Symmetrical, normal in size, without palpable masses or nodularity. Respiratory  Auscultation:  Clear without wheezing or rhonchi Cardiovascular  Auscultation:  Regular rate, without rubs, murmurs or gallops  Edema/varicosities:  Not grossly evident Abdominal  Soft,nontender, without masses, guarding or rebound.  Liver/spleen:  No organomegaly noted  Hernia:  None appreciated  Skin  Inspection:  Grossly normal Breasts: Examined lying and sitting. Scarring present from reduction  Right: Without masses, retractions, nipple  discharge or axillary adenopathy.   Left: Without masses, retractions, nipple discharge or axillary adenopathy. Genitourinary   Inguinal/mons:  Normal without inguinal adenopathy  External genitalia:  Normal appearing vulva with no masses, tenderness, or lesions  BUS/Urethra/Skene's glands:  Normal  Vagina:  Normal appearing with normal color and discharge, no lesions. Atrophy: mild   Cervix:  Normal appearing without discharge or lesions. Pap obtained  Uterus:  absent  Adnexa/parametria:  absent  Anus and perineum: Normal    Patient informed chaperone available to be present for breast and pelvic exam. Patient has requested no chaperone to be present. Patient has been advised what will be completed during breast and pelvic exam.   Assessment/Plan:   1. 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  Dispense: 24 patch; Refill: 4 - progesterone (PROMETRIUM) 100 MG capsule; Take 1 capsule (100 mg total) by mouth daily.  Dispense: 90 capsule; Refill: 4  2. Well woman exam with routine gynecological exam - Cytology - PAP( [place])      Discussed SBE, colonoscopy and DEXA screening as directed. Recommend of exercise weekly, including weight bearing exercise. Encouraged the use of seatbelts and sunscreen.  Return in 1 year for annual or sooner prn.  Arlie Solomons B WHNP-BC, 3:17 PM 11/07/2022

## 2022-11-11 ENCOUNTER — Ambulatory Visit: Payer: BC Managed Care – PPO | Admitting: Radiology

## 2022-11-11 LAB — CYTOLOGY - PAP
Adequacy: ABSENT
Comment: NEGATIVE
Diagnosis: NEGATIVE
High risk HPV: NEGATIVE

## 2022-11-26 ENCOUNTER — Ambulatory Visit: Payer: BC Managed Care – PPO | Admitting: Radiology

## 2022-12-25 ENCOUNTER — Encounter: Payer: Self-pay | Admitting: Family Medicine

## 2022-12-25 ENCOUNTER — Ambulatory Visit: Payer: BC Managed Care – PPO | Admitting: Family Medicine

## 2022-12-25 VITALS — BP 122/79 | HR 78 | Ht 66.0 in | Wt 129.0 lb

## 2022-12-25 DIAGNOSIS — F4310 Post-traumatic stress disorder, unspecified: Secondary | ICD-10-CM

## 2022-12-25 DIAGNOSIS — F410 Panic disorder [episodic paroxysmal anxiety] without agoraphobia: Secondary | ICD-10-CM | POA: Diagnosis not present

## 2022-12-25 MED ORDER — CLONAZEPAM 0.5 MG PO TABS: 0.5000 mg | ORAL_TABLET | Freq: Three times a day (TID) | ORAL | 0 refills | Status: DC | PRN

## 2022-12-25 NOTE — Assessment & Plan Note (Signed)
Chronic, recently exacerbated  Defer changing effexor or trazodone at this time Previously working with both therapist, Joanna Cervantes, and life [facility] Trial of klonopin to assist to prevent worsening with complete change in previously well established regimen -continue effexor 225 -continue trazodone 50 -trial of 0.5 mg klonopin tid prn; 90# no refills

## 2022-12-25 NOTE — Progress Notes (Signed)
Established patient visit   Patient: Joanna Cervantes   DOB: 1971/04/05   51 y.o. Female  MRN: [id number] Visit Date: 12/25/2022  Today's healthcare provider: Jacky Kindle, FNP  Re Introduced to nurse practitioner role and practice setting.  All questions answered.  Discussed provider/patient relationship and expectations.  Chief Complaint  Patient presents with   Follow-up    Anxiety/depression.   Subjective    HPI HPI     Follow-up    Additional comments: Anxiety/depression.        Comments   Pt stated more anxiety than depression.      Last edited by Shelly Bombard, CMA on 12/25/2022 11:01 AM.      Medications: Outpatient Medications Prior to Visit  Medication Sig   albuterol (VENTOLIN HFA) 108 (90 Base) MCG/ACT inhaler Inhale 2 puffs into the lungs every 6 (six) hours as needed for wheezing or shortness of breath.   Ascorbic Acid (VITAMIN C PO) Take by mouth.   Cyanocobalamin (B-12 PO) Take by mouth.   estradiol (DOTTI) 0.075 MG/24HR Place 1 patch onto the skin 2 (two) times a week.   ibuprofen (ADVIL,MOTRIN) 200 MG tablet Take 400 mg by mouth every 6 (six) hours as needed.   ipratropium (ATROVENT) 0.03 % nasal spray Place 2 sprays into both nostrils every 12 (twelve) hours.   MAGNESIUM PO Take by mouth.   montelukast (SINGULAIR) 10 MG tablet Take 1 tablet (10 mg total) by mouth at bedtime.   Multiple Vitamins-Minerals (CENTRUM SILVER PO) Take by mouth.   progesterone (PROMETRIUM) 100 MG capsule Take 1 capsule (100 mg total) by mouth daily.   Pyridoxine HCl (VITAMIN B-6 PO) Take by mouth.   traZODone (DESYREL) 50 MG tablet Take 1 tablet (50 mg total) by mouth at bedtime as needed for sleep.   venlafaxine XR (EFFEXOR-XR) 150 MG 24 hr capsule Take 1 capsule (150 mg total) by mouth daily with breakfast. Take with 75 mg (total 225 mg)   venlafaxine XR (EFFEXOR-XR) 75 MG 24 hr capsule Take 1 capsule (75 mg total) by mouth daily with breakfast. Take with 150 mg  (total 225 mg)   VITAMIN D PO Take by mouth.   No facility-administered medications prior to visit.    Review of Systems    Objective    BP 122/79 (BP Location: Left Arm, Patient Position: Sitting, Cuff Size: Normal)   Pulse 78   Ht 5\' 6"  (1.676 m)   Wt 129 lb (58.5 kg)   SpO2 98%   BMI 20.82 kg/m   Physical Exam Constitutional:      General: She is not in acute distress.    Appearance: Normal appearance. She is normal weight. She is not ill-appearing, toxic-appearing or diaphoretic.  Cardiovascular:     Rate and Rhythm: Normal rate.  Pulmonary:     Effort: Pulmonary effort is normal.  Musculoskeletal:        General: Normal range of motion.  Skin:    General: Skin is warm and dry.  Neurological:     General: No focal deficit present.     Mental Status: She is alert and oriented to person, place, and time. Mental status is at baseline.  Psychiatric:        Attention and Perception: Attention and perception normal.        Mood and Affect: Mood is depressed. Affect is tearful.        Speech: Speech normal.  Behavior: Behavior normal. Behavior is cooperative.        Thought Content: Thought content normal. Thought content does not include homicidal or suicidal ideation. Thought content does not include homicidal or suicidal plan.        Cognition and Memory: Cognition and memory normal.        Judgment: Judgment normal.     No results found for any visits on 12/25/22.  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     Leilani Merl, FNP, have reviewed all documentation for this visit. The documentation on 12/25/22 for the exam, diagnosis, procedures, and orders are all accurate and complete.  Jacky Kindle, FNP  [facility] [phone] (phone) [phone] (fax)  [facility]

## 2022-12-26 ENCOUNTER — Other Ambulatory Visit: Payer: Self-pay | Admitting: Family Medicine

## 2023-10-02 ENCOUNTER — Ambulatory Visit
Admission: RE | Admit: 2023-10-02 | Discharge: 2023-10-02 | Disposition: A | Discharge status: Home / Self Care | Source: Ambulatory Visit | Attending: Emergency Medicine | Admitting: Emergency Medicine

## 2023-10-02 ENCOUNTER — Ambulatory Visit: Payer: Self-pay

## 2023-10-02 VITALS — BP 139/69 | HR 83 | Temp 97.7°F | Resp 18

## 2023-10-02 DIAGNOSIS — R110 Nausea: Secondary | ICD-10-CM | POA: Diagnosis not present

## 2023-10-02 DIAGNOSIS — R519 Headache, unspecified: Secondary | ICD-10-CM

## 2023-10-02 MED ORDER — ONDANSETRON 4 MG PO TBDP: 4.0000 mg | ORAL_TABLET | Freq: Three times a day (TID) | ORAL | 0 refills | Status: AC | PRN

## 2023-10-02 NOTE — ED Provider Notes (Signed)
 Joanna Cervantes    CSN: [id number] Arrival date & time: 10/02/23  1553      History   Chief Complaint Chief Complaint  Patient presents with   Migraine    Recurring migraines resulting in violent vomiting episodes. Occur every 1-2 weeks, last 3-5 days. OTC treatment no longer effective. Spoke with [facility] Nurse Candace who advised I be seen today for  medication and possible scan. - Entered by patient    HPI Joanna Cervantes is a 53 y.o. female.  Patient presents with recurrent migraine headaches for several months.  She woke up this morning with a headache; took ibuprofen and her headache resolved.  Currently no headache.  She has residual nausea; no vomiting.  She states her headaches have been increasing in frequency and duration over the last year.  She denies numbness, weakness, dizziness, vision change, chest pain, shortness of breath.  She reports history of migraine headaches since she was a teenager.  The history is provided by the patient and medical records.    Past Medical History:  Diagnosis Date   Abnormal Pap smear of cervix    Bulging of cervical intervertebral disc    sees chiropractor for alignment monthly   Depression    Endometriosis    s/p partial hysterectomy 2013   Family history of adverse reaction to anesthesia    Father - PONV   History of chicken pox    Irregular menses    Migraine headache    4-6x/yr   Motion sickness    back seat of car   PONV (postoperative nausea and vomiting)    severe   Vertigo    3 episodes.  last one approx 2 months ago    Patient Active Problem List   Diagnosis Date Noted   Panic attack due to post traumatic stress disorder (PTSD) 12/25/2022   Bronchitis 10/24/2022   Hx of dizziness 07/19/2021   Encounter for screening mammogram for malignant neoplasm of breast 07/19/2021   Psychophysiological insomnia 06/05/2021   Annual physical exam 06/11/2017   Major depression 07/19/2015    Past Surgical  History:  Procedure Laterality Date   ABDOMINAL HYSTERECTOMY  11/2011   not due to cancer; Endometriosis and Ovarian cyst. Still has cervix   APPENDECTOMY  08/13/2011   Diagnostic Laproscopy, prophylactic appendectomy, and drainage of right ovarian cyst done by Dr. Lemar Livings at [facility]   BILATERAL SALPINGOOPHORECTOMY     BREAST ENHANCEMENT SURGERY  2000   reduction - DR. MORRISON   CERVIX LESION DESTRUCTION     COLPOSCOPY  02/20/2003   ENDOMETRIAL ABLATION  09/10/2006   BVD   HYSTEROSCOPY  09/10/2006   D&C; WITH NOVASURE AND TUBAL LIGATION; BVD   LAPAROSCOPIC SUPRACERVICAL HYSTERECTOMY     LARYNGOSCOPY  11/03/2006   DR MCQUEEN, SMALL CYSTIC AREA OF THE RIGHT ANTERIOR VOCAL FOLD.   LEEP  2004   MM BREAST STEREO BIOPSY LEFT ([facility] HX)  08/13/2009   REDUCTION MAMMAPLASTY Bilateral 2000   REFRACTIVE SURGERY Left 08/14/2022   lasik repair   SHOULDER ARTHROSCOPY Right 05/06/2017   Procedure: Limited arthroscopic debridement, arthroscopic subacromial decompression, mini-open rotator cuff repair, and mini-open biceps tenodesis, right shoulder;  Surgeon: Christena Flake, MD;  Location: [facility];  Service: Orthopedics;  Laterality: Right;   TUBAL LIGATION  2008   Laporoscopic tubal ligation by cautery   TUBAL LIGATION  09/10/2006   BVD    OB History     Gravida  1  Para  0   Term      Preterm      AB  1   Living  0      SAB      IAB      Ectopic      Multiple      Live Births               Home Medications    Prior to Admission medications   Medication Sig Start Date End Date Taking? Authorizing Provider  ondansetron (ZOFRAN-ODT) 4 MG disintegrating tablet Take 1 tablet (4 mg total) by mouth every 8 (eight) hours as needed for nausea or vomiting. 10/02/23  Yes Mickie Bail, NP  albuterol (VENTOLIN HFA) 108 (90 Base) MCG/ACT inhaler Inhale 2 puffs into the lungs every 6 (six) hours as needed for wheezing or shortness of breath. 10/21/22   Jacky Kindle, FNP   Ascorbic Acid (VITAMIN C PO) Take by mouth.    [provider]  clonazePAM (KLONOPIN) 0.5 MG tablet Take 1 tablet (0.5 mg total) by mouth 3 (three) times daily as needed for anxiety. 12/25/22   Jacky Kindle, FNP  Cyanocobalamin (B-12 PO) Take by mouth.    [provider]  estradiol (DOTTI) 0.075 MG/24HR Place 1 patch onto the skin 2 (two) times a week. 11/10/22   Chrzanowski, Jami B, NP  ibuprofen (ADVIL,MOTRIN) 200 MG tablet Take 400 mg by mouth every 6 (six) hours as needed.    [provider]  ipratropium (ATROVENT) 0.03 % nasal spray Place 2 sprays into both nostrils every 12 (twelve) hours. 10/21/22   Jacky Kindle, FNP  MAGNESIUM PO Take by mouth.    [provider]  montelukast (SINGULAIR) 10 MG tablet Take 1 tablet (10 mg total) by mouth at bedtime. 10/21/22   Jacky Kindle, FNP  Multiple Vitamins-Minerals (CENTRUM SILVER PO) Take by mouth.    [provider]  progesterone (PROMETRIUM) 100 MG capsule Take 1 capsule (100 mg total) by mouth daily. 11/07/22   Chrzanowski, Jami B, NP  Pyridoxine HCl (VITAMIN B-6 PO) Take by mouth.    [provider]  traZODone (DESYREL) 50 MG tablet Take 1 tablet (50 mg total) by mouth at bedtime as needed for sleep. 10/21/22   Jacky Kindle, FNP  venlafaxine XR (EFFEXOR-XR) 150 MG 24 hr capsule Take 1 capsule (150 mg total) by mouth daily with breakfast. Take with 75 mg (total 225 mg) 10/21/22   Jacky Kindle, FNP  venlafaxine XR (EFFEXOR-XR) 75 MG 24 hr capsule Take 1 capsule (75 mg total) by mouth daily with breakfast. Take with 150 mg (total 225 mg) 10/21/22   Merita Norton T, FNP  VITAMIN D PO Take by mouth.    [provider]    Family History Family History  Problem Relation Age of Onset   Hypertension Mother    Hypertension Father    Other Father        C-diff   Diabetes Brother    Heart attack Maternal Grandfather    Parkinson's disease Maternal Grandfather    Cancer Maternal Grandfather         PROSTATE   Heart attack Paternal Grandfather    Lung cancer Paternal Grandfather    Breast cancer Neg Hx     Social History Social History   Tobacco Use   Smoking status: Former    Current packs/day: 0.00    Types: Cigarettes    Quit date:  07/15/2007    Years since quitting: 16.2   Smokeless tobacco: Never  Vaping Use   Vaping status: Never Used  Substance Use Topics   Alcohol use: Yes    Alcohol/week: 4.0 standard drinks of alcohol    Types: 4 Glasses of wine per week    Comment: occasional use   Drug use: No     Allergies   Mushroom extract complex (obsolete), Buspirone, Aspirin, Celecoxib, and Bupropion   Review of Systems Review of Systems  Constitutional:  Negative for chills and fever.  Eyes:  Negative for visual disturbance.  Respiratory:  Negative for cough and shortness of breath.   Cardiovascular:  Negative for chest pain and palpitations.  Gastrointestinal:  Positive for nausea. Negative for abdominal pain, diarrhea and vomiting.  Neurological:  Positive for headaches. Negative for dizziness, syncope, facial asymmetry, speech difficulty, weakness, light-headedness and numbness.     Physical Exam Triage Vital Signs ED Triage Vitals [10/02/23 1605]  Encounter Vitals Group     BP 139/69     Systolic BP Percentile      Diastolic BP Percentile      Pulse Rate 83     Resp 18     Temp 97.7 F (36.5 C)     Temp src      SpO2 98 %     Weight      Height      Head Circumference      Peak Flow      Pain Score      Pain Loc      Pain Education      Exclude from Growth Chart    No data found.  Updated Vital Signs BP 139/69   Pulse 83   Temp 97.7 F (36.5 C)   Resp 18   SpO2 98%   Visual Acuity Right Eye Distance:   Left Eye Distance:   Bilateral Distance:    Right Eye Near:   Left Eye Near:    Bilateral Near:     Physical Exam Constitutional:      General: She is not in acute distress. HENT:     Mouth/Throat:     Mouth:  Mucous membranes are moist.  Eyes:     Pupils: Pupils are equal, round, and reactive to light.  Cardiovascular:     Rate and Rhythm: Normal rate and regular rhythm.     Heart sounds: Normal heart sounds.  Pulmonary:     Effort: Pulmonary effort is normal. No respiratory distress.     Breath sounds: Normal breath sounds.  Abdominal:     General: Bowel sounds are normal.     Palpations: Abdomen is soft.     Tenderness: There is no abdominal tenderness. There is no guarding or rebound.  Skin:    General: Skin is warm and dry.  Neurological:     General: No focal deficit present.     Mental Status: She is alert and oriented to person, place, and time.     Cranial Nerves: No cranial nerve deficit.     Sensory: No sensory deficit.     Motor: No weakness.     Gait: Gait normal.  Psychiatric:        Mood and Affect: Mood normal.        Behavior: Behavior normal.      UC Treatments / Results  Labs (all labs ordered are listed, but only abnormal results are displayed) Labs Reviewed - No data to display  EKG   Radiology No results found.  Procedures Procedures (including critical care time)  Medications Ordered in UC Medications - No data to display  Initial Impression / Assessment and Plan / UC Course  I have reviewed the triage vital signs and the nursing notes.  Pertinent labs & imaging results that were available during my care of the patient were reviewed by me and considered in my medical decision making (see chart for details).   Headache, nausea.  Afebrile and vital signs are stable.  Exam is reassuring.  Patient does not currently have a headache.  She has some nausea.  No vomiting.  Treating nausea with Zofran.  Instructed patient to schedule a follow-up with her PCP.  ED precautions given.  Education provided on migraine headache and nausea.  She agrees to plan of care.    Final Clinical Impressions(s) / UC Diagnoses   Final diagnoses:  Acute nonintractable  headache, unspecified headache type  Nausea without vomiting     Discharge Instructions      Follow up with your primary care provider.  Go to the emergency department if you have worsening symptoms.    Take the Zofran as directed for nausea.      ED Prescriptions     Medication Sig Dispense Auth. Provider   ondansetron (ZOFRAN-ODT) 4 MG disintegrating tablet Take 1 tablet (4 mg total) by mouth every 8 (eight) hours as needed for nausea or vomiting. 20 tablet Mickie Bail, NP      PDMP not reviewed this encounter.   Mickie Bail, NP 10/02/23 [phone]

## 2023-10-02 NOTE — ED Triage Notes (Addendum)
 Patient to Urgent Care with complaints of recurring migraine headaches. Feels as though they are becoming more frequent and more severe. Episodes started worsening over the last six months. States she has nausea/ vomiting/ sensitivity to light/ restlessness. 3-5 days in duration.   Reports she had a migraine starting this morning but was able to find some relief with ibuprofen. No current pain- some nausea

## 2023-10-02 NOTE — Discharge Instructions (Addendum)
 Follow up with your primary care provider.  Go to the emergency department if you have worsening symptoms.    Take the Zofran as directed for nausea.

## 2023-10-02 NOTE — Telephone Encounter (Signed)
 Copied from CRM [id number]. Topic: Clinical - Red Word Triage >> Oct 02, 2023  9:39 AM Marland Kitchen D wrote: Patient is having worsening headaches  Chief Complaint: headache; hx of migraines. Symptoms: headache Frequency: constant Pertinent Negatives: Patient denies fever, cold like symptoms, numbness, weakness Disposition: [] ED /[x] Urgent Care (no appt availability in office) / [] Appointment(In office/virtual)/ []  [place] [facility]/ [] Home Care/ [] Refused Recommended Disposition /[] [place] [facility]/ []  Follow-up with PCP Additional Notes: no apt available for today; instructed to go to UC; care advice given, denies questions; instructed to go to ER if becomes worse.   Reason for Disposition  [1] MODERATE headache (e.g., interferes with normal activities) AND [2] present > 24 hours AND [3] unexplained  (Exceptions: analgesics not tried, typical migraine, or headache part of viral illness)  Answer Assessment - Initial Assessment Questions 1. LOCATION: "Where does it hurt?"      Back of the head; hx migraine 2. ONSET: "When did the headache start?" (Minutes, hours or days)      Six to 9 weeks and maybe even a year, intensity is bad 3. PATTERN: "Does the pain come and go, or has it been constant since it started?"     constant 4. SEVERITY: "How bad is the pain?" and "What does it keep you from doing?"  (e.g., Scale 1-10; mild, moderate, or severe)   - MILD (1-3): doesn't interfere with normal activities    - MODERATE (4-7): interferes with normal activities or awakens from sleep    - SEVERE (8-10): excruciating pain, unable to do any normal activities       9/10 5. RECURRENT SYMPTOM: "Have you ever had headaches before?" If Yes, ask: "When was the last time?" and "What happened that time?"      Yes, migraine with nausea 6. CAUSE: "What do you think is causing the headache?"     migraine 7. MIGRAINE: "Have you been diagnosed with migraine headaches?" If Yes, ask: "Is this headache  similar?"      yes 8. HEAD INJURY: "Has there been any recent injury to the head?"      denies 9. OTHER SYMPTOMS: "Do you have any other symptoms?" (fever, stiff neck, eye pain, sore throat, cold symptoms)     denies 10. PREGNANCY: "Is there any chance you are pregnant?" "When was your last menstrual period?"       na  Protocols used: [facility]

## 2023-10-07 ENCOUNTER — Other Ambulatory Visit: Payer: Self-pay | Admitting: Radiology

## 2023-10-07 DIAGNOSIS — Z1231 Encounter for screening mammogram for malignant neoplasm of breast: Secondary | ICD-10-CM

## 2023-10-12 ENCOUNTER — Telehealth: Payer: Self-pay | Admitting: Family Medicine

## 2023-10-12 DIAGNOSIS — F5104 Psychophysiologic insomnia: Secondary | ICD-10-CM

## 2023-10-12 NOTE — Telephone Encounter (Signed)
 [facility] faxed refill request for the following medications:   traZODone (DESYREL) 50 MG tablet   Pt is seeing Dr. Leonard Schwartz on 10/13/2023   Please advise.

## 2023-10-13 ENCOUNTER — Encounter: Payer: Self-pay | Admitting: Family Medicine

## 2023-10-13 ENCOUNTER — Ambulatory Visit: Admitting: Family Medicine

## 2023-10-13 VITALS — BP 118/64 | HR 90 | Ht 66.0 in | Wt 129.6 lb

## 2023-10-13 DIAGNOSIS — G43709 Chronic migraine without aura, not intractable, without status migrainosus: Secondary | ICD-10-CM | POA: Diagnosis not present

## 2023-10-13 DIAGNOSIS — F339 Major depressive disorder, recurrent, unspecified: Secondary | ICD-10-CM

## 2023-10-13 MED ORDER — PROPRANOLOL HCL ER 60 MG PO CP24: 60.0000 mg | ORAL_CAPSULE | Freq: Every day | ORAL | 2 refills | Status: DC

## 2023-10-13 MED ORDER — PROMETHAZINE HCL 25 MG PO TABS: 12.5000 mg | ORAL_TABLET | Freq: Three times a day (TID) | ORAL | 1 refills | Status: DC | PRN

## 2023-10-13 MED ORDER — TRAZODONE HCL 50 MG PO TABS: 50.0000 mg | ORAL_TABLET | Freq: Every evening | ORAL | 3 refills | Status: DC | PRN

## 2023-10-13 MED ORDER — SUMATRIPTAN SUCCINATE 50 MG PO TABS: 50.0000 mg | ORAL_TABLET | ORAL | 5 refills | Status: AC | PRN

## 2023-10-13 NOTE — Progress Notes (Signed)
 Acute visit   Patient: Joanna Cervantes   DOB: 1971/07/08   53 y.o. Female  MRN: [id number] PCP: Sherlyn Hay, DO   Chief Complaint  Patient presents with   Follow-up    Patient seen at [facility] on 10/02/23. Patient was seen for new onset headaches. Was provided Zofran for nausea. Pt reports having 2 migraines since her visit and they weren't as bad as before. Reports one last a day and the other last about 2.5 days. Pt reports historically she would take Excedrin migraine and they would help but not anymore. Migraines associated with jittery and nausea.    Subjective    Discussed the use of AI scribe software for clinical note transcription with the patient, who gave verbal consent to proceed.  History of Present Illness   The patient, with a long-standing history of migraines since puberty, reports a change in the nature and frequency of the migraines over the past year and a half. The migraines, which used to be infrequent and characterized by blinding pain and light sensitivity, have evolved into more frequent episodes lasting several days. These episodes are accompanied by nausea, bloating, and vomiting, but without the previous light sensitivity. The patient has been managing these episodes with over-the-counter medications like Excedrin and Tylenol, but these have become less effective over time. The patient also has a history of hysterectomy and has been on hormone replacement therapy for several years. The patient is also dealing with depression and anxiety, which are currently not well managed.        Review of Systems  Objective    BP 118/64 (BP Location: Left Arm, Patient Position: Sitting, Cuff Size: Normal)   Pulse 90   Ht 5\' 6"  (1.676 m)   Wt 129 lb 9.6 oz (58.8 kg)   SpO2 100%   BMI 20.92 kg/m   Physical Exam Vitals reviewed.  Constitutional:      General: She is not in acute distress.    Appearance: Normal appearance. She is  well-developed. She is not diaphoretic.  HENT:     Head: Normocephalic and atraumatic.  Eyes:     General: No scleral icterus.    Conjunctiva/sclera: Conjunctivae normal.  Neck:     Thyroid: No thyromegaly.  Cardiovascular:     Rate and Rhythm: Normal rate and regular rhythm.     Heart sounds: Normal heart sounds. No murmur heard. Pulmonary:     Effort: Pulmonary effort is normal. No respiratory distress.     Breath sounds: Normal breath sounds. No wheezing, rhonchi or rales.  Musculoskeletal:     Cervical back: Neck supple.     Right lower leg: No edema.     Left lower leg: No edema.  Lymphadenopathy:     Cervical: No cervical adenopathy.  Skin:    General: Skin is warm and dry.     Findings: No rash.  Neurological:     Mental Status: She is alert and oriented to person, place, and time. Mental status is at baseline.     Cranial Nerves: No cranial nerve deficit.     Sensory: No sensory deficit.     Motor: No weakness.     Coordination: Coordination normal.     Gait: Gait normal.       No results found for any visits on 10/13/23.  Assessment & Plan     Problem List Items Addressed This Visit  Other   Major depression   Other Visit Diagnoses       Chronic migraine without aura without status migrainosus, not intractable    -  Primary   Relevant Medications   SUMAtriptan (IMITREX) 50 MG tablet   propranolol ER (INDERAL LA) 60 MG 24 hr capsule          Chronic Migraines Migraines have increased in frequency and duration, lasting 3-5 days, with associated nausea, vomiting, and abdominal symptoms. Over-the-counter medications are ineffective. Migraines are likely related to hormonal changes and stress. Current medications include hormone replacement therapy and Effexor. - Prescribe sumatriptan 50 mg at the first sign of a migraine, with a second dose after 2 hours if symptoms persist. Limit to 2 doses per day and 3 doses per week. - Prescribe Phenergan at  bedtime for nausea and to induce sleep. - Prescribe propranolol once daily for migraine prevention. - Advise monthly refill of sumatriptan to ensure an adequate supply.  Major Depressive Disorder and Anxiety Depression and anxiety are not well-managed. Effexor may no longer be effective, as indicated by emotional distress and crying episodes. - Discuss management of depression and anxiety with Dr. Payton Mccallum in a follow-up appointment.  Hormone Replacement Therapy Management Continued estrogen therapy is necessary due to symptom recurrence upon reduction. Progesterone is not required post-hysterectomy. Estrogen is beneficial for bone and heart health. - Continue estrogen therapy without progesterone.  Follow-up Ongoing management is needed for migraines, depression, and anxiety. Transition to Dr. Payton Mccallum for continued care. - Schedule a follow-up appointment with Dr. Payton Mccallum in 6 weeks to assess the effectiveness of the new migraine treatment plan and discuss ongoing management of depression and anxiety.       Meds ordered this encounter  Medications   promethazine (PHENERGAN) 25 MG tablet    Sig: Take 0.5-1 tablets (12.5-25 mg total) by mouth every 8 (eight) hours as needed for nausea or vomiting (migraine).    Dispense:  20 tablet    Refill:  1   SUMAtriptan (IMITREX) 50 MG tablet    Sig: Take 1 tablet (50 mg total) by mouth every 2 (two) hours as needed for migraine. May repeat in 2 hours if headache persists or recurs.    Dispense:  10 tablet    Refill:  5   propranolol ER (INDERAL LA) 60 MG 24 hr capsule    Sig: Take 1 capsule (60 mg total) by mouth daily.    Dispense:  30 capsule    Refill:  2     Return in about 6 weeks (around 11/24/2023) for chronic disease f/u, With PCP.      Shirlee Latch, MD  [facility] [phone] (phone) [phone] (fax)  [facility]

## 2023-10-26 ENCOUNTER — Other Ambulatory Visit: Payer: Self-pay

## 2023-10-26 ENCOUNTER — Telehealth: Payer: Self-pay | Admitting: Family Medicine

## 2023-10-26 ENCOUNTER — Ambulatory Visit
Admission: RE | Admit: 2023-10-26 | Discharge: 2023-10-26 | Disposition: A | Discharge status: Home / Self Care | Source: Ambulatory Visit | Attending: Radiology | Admitting: Radiology

## 2023-10-26 DIAGNOSIS — Z1231 Encounter for screening mammogram for malignant neoplasm of breast: Secondary | ICD-10-CM | POA: Diagnosis present

## 2023-10-26 DIAGNOSIS — F339 Major depressive disorder, recurrent, unspecified: Secondary | ICD-10-CM

## 2023-10-26 NOTE — Telephone Encounter (Signed)
 [facility] faxed refill request for the following medications:   venlafaxine XR (EFFEXOR-XR) 75 MG 24 hr capsule    Please advise.

## 2023-10-27 ENCOUNTER — Telehealth: Payer: Self-pay

## 2023-10-27 MED ORDER — VENLAFAXINE HCL ER 75 MG PO CP24: 75.0000 mg | ORAL_CAPSULE | Freq: Every day | ORAL | 3 refills | Status: DC

## 2023-10-27 MED ORDER — VENLAFAXINE HCL ER 150 MG PO CP24: 150.0000 mg | ORAL_CAPSULE | Freq: Every day | ORAL | 3 refills | Status: DC

## 2023-11-03 NOTE — Telephone Encounter (Signed)
 Joanna Cervantes

## 2023-11-10 ENCOUNTER — Encounter: Payer: Self-pay | Admitting: Radiology

## 2023-11-10 ENCOUNTER — Ambulatory Visit (INDEPENDENT_AMBULATORY_CARE_PROVIDER_SITE_OTHER): Payer: Self-pay | Admitting: Radiology

## 2023-11-10 VITALS — BP 112/66 | HR 70 | Ht 66.0 in | Wt 128.0 lb

## 2023-11-10 DIAGNOSIS — Z01419 Encounter for gynecological examination (general) (routine) without abnormal findings: Secondary | ICD-10-CM

## 2023-11-10 DIAGNOSIS — Z79890 Hormone replacement therapy (postmenopausal): Secondary | ICD-10-CM | POA: Diagnosis not present

## 2023-11-10 DIAGNOSIS — N952 Postmenopausal atrophic vaginitis: Secondary | ICD-10-CM

## 2023-11-10 DIAGNOSIS — Z1331 Encounter for screening for depression: Secondary | ICD-10-CM

## 2023-11-10 DIAGNOSIS — Z1211 Encounter for screening for malignant neoplasm of colon: Secondary | ICD-10-CM

## 2023-11-10 DIAGNOSIS — E8940 Asymptomatic postprocedural ovarian failure: Secondary | ICD-10-CM

## 2023-11-10 MED ORDER — ESTRADIOL 0.075 MG/24HR TD PTTW
1.0000 | MEDICATED_PATCH | TRANSDERMAL | 4 refills | Status: AC
Start: 2023-11-12 — End: ?

## 2023-11-10 MED ORDER — ESTRADIOL 10 MCG VA TABS: 1.0000 | ORAL_TABLET | VAGINAL | 11 refills | Status: DC

## 2023-11-10 NOTE — Patient Instructions (Signed)
 Preventive Care 16-52 Years Old, Female  Preventive care refers to lifestyle choices and visits with your health care provider that can promote health and wellness. Preventive care visits are also called wellness exams.  What can I expect for my preventive care visit?  Counseling  Your health care provider may ask you questions about your:  Medical history, including:  Past medical problems.  Family medical history.  Pregnancy history.  Current health, including:  Menstrual cycle.  Method of birth control.  Emotional well-being.  Home life and relationship well-being.  Sexual activity and sexual health.  Lifestyle, including:  Alcohol, nicotine or tobacco, and drug use.  Access to firearms.  Diet, exercise, and sleep habits.  Work and work Astronomer.  Sunscreen use.  Safety issues such as seatbelt and bike helmet use.  Physical exam  Your health care provider will check your:  Height and weight. These may be used to calculate your BMI (body mass index). BMI is a measurement that tells if you are at a healthy weight.  Waist circumference. This measures the distance around your waistline. This measurement also tells if you are at a healthy weight and may help predict your risk of certain diseases, such as type 2 diabetes and high blood pressure.  Heart rate and blood pressure.  Body temperature.  Skin for abnormal spots.  What immunizations do I need?    Vaccines are usually given at various ages, according to a schedule. Your health care provider will recommend vaccines for you based on your age, medical history, and lifestyle or other factors, such as travel or where you work.  What tests do I need?  Screening  Your health care provider may recommend screening tests for certain conditions. This may include:  Lipid and cholesterol levels.  Diabetes screening. This is done by checking your blood sugar (glucose) after you have not eaten for a while (fasting).  Pelvic exam and Pap test.  Hepatitis B test.  Hepatitis C  test.  HIV (human immunodeficiency virus) test.  STI (sexually transmitted infection) testing, if you are at risk.  Lung cancer screening.  Colorectal cancer screening.  Mammogram. Talk with your health care provider about when you should start having regular mammograms. This may depend on whether you have a family history of breast cancer.  BRCA-related cancer screening. This may be done if you have a family history of breast, ovarian, tubal, or peritoneal cancers.  Bone density scan. This is done to screen for osteoporosis.  Talk with your health care provider about your test results, treatment options, and if necessary, the need for more tests.  Follow these instructions at home:  Eating and drinking    Eat a diet that includes fresh fruits and vegetables, whole grains, lean protein, and low-fat dairy products.  Take vitamin and mineral supplements as recommended by your health care provider.  Do not drink alcohol if:  Your health care provider tells you not to drink.  You are pregnant, may be pregnant, or are planning to become pregnant.  If you drink alcohol:  Limit how much you have to 0-1 drink a day.  Know how much alcohol is in your drink. In the [place] one drink equals one 12 oz bottle of beer (355 mL), one 5 oz glass of wine (148 mL), or one 1 oz glass of hard liquor (44 mL).  Lifestyle  Brush your teeth every morning and night with fluoride toothpaste. Floss one time each day.  Exercise for at least  30 minutes 5 or more days each week.  Do not use any products that contain nicotine or tobacco. These products include cigarettes, chewing tobacco, and vaping devices, such as e-cigarettes. If you need help quitting, ask your health care provider.  Do not use drugs.  If you are sexually active, practice safe sex. Use a condom or other form of protection to prevent STIs.  If you do not wish to become pregnant, use a form of birth control. If you plan to become pregnant, see your health care provider for a  prepregnancy visit.  Take aspirin only as told by your health care provider. Make sure that you understand how much to take and what form to take. Work with your health care provider to find out whether it is safe and beneficial for you to take aspirin daily.  Find healthy ways to manage stress, such as:  Meditation, yoga, or listening to music.  Journaling.  Talking to a trusted person.  Spending time with friends and family.  Minimize exposure to UV radiation to reduce your risk of skin cancer.  Safety  Always wear your seat belt while driving or riding in a vehicle.  Do not drive:  If you have been drinking alcohol. Do not ride with someone who has been drinking.  When you are tired or distracted.  While texting.  If you have been using any mind-altering substances or drugs.  Wear a helmet and other protective equipment during sports activities.  If you have firearms in your house, make sure you follow all gun safety procedures.  Seek help if you have been physically or sexually abused.  What's next?  Visit your health care provider once a year for an annual wellness visit.  Ask your health care provider how often you should have your eyes and teeth checked.  Stay up to date on all vaccines.  This information is not intended to replace advice given to you by your health care provider. Make sure you discuss any questions you have with your health care provider.  Document Revised: 12/26/2020 Document Reviewed: 12/26/2020  Elsevier Patient Education  2024 [employer].

## 2023-11-10 NOTE — Progress Notes (Signed)
   Joanna Cervantes Sep 16, 1970 [id number]   History:  53 y.o. G1P0 presents for annual exam. Doing well on HRT, stopped progesterone  due to weight gain and bloat, feels better off it. Hx of Supracervical hyst and BSO. Exercises regularly. Has a PCP.  Gynecologic History Hysterectomy: 2013 supracervical  Sexually active: no  Health Maintenance Last Pap: 10/2022. Results were: normal, HPV neg Last mammogram: 10/26/23. Results were: normal Last colonoscopy: cologuard 2022 normal       11/10/2023    3:16 PM 10/13/2023    2:06 PM 12/25/2022   11:05 AM  Depression screen PHQ 2/9  Decreased Interest 1 2 3   Down, Depressed, Hopeless 1 2 1   PHQ - 2 Score 2 4 4   Altered sleeping 3 2 2   Tired, decreased energy 3 3 2   Change in appetite 0 0 1  Feeling bad or failure about yourself  0 1 2  Trouble concentrating 2 0 1  Moving slowly or fidgety/restless 2 1 2   Suicidal thoughts 0 0 0  PHQ-9 Score 12 11 14   Difficult doing work/chores Very difficult Somewhat difficult Somewhat difficult     Past medical history, past surgical history, family history and social history were all reviewed and documented in the EPIC chart.  ROS:  A ROS was performed and pertinent positives and negatives are included.  Exam:  Vitals:   11/10/23 1515  BP: 112/66  Pulse: 70  SpO2: 95%  Weight: 128 lb (58.1 kg)  Height: 5\' 6"  (1.676 m)   Body mass index is 20.66 kg/m.  General appearance:  Normal Thyroid:  Symmetrical, normal in size, without palpable masses or nodularity. Respiratory  Auscultation:  Clear without wheezing or rhonchi Cardiovascular  Auscultation:  Regular rate, without rubs, murmurs or gallops  Edema/varicosities:  Not grossly evident Abdominal  Soft,nontender, without masses, guarding or rebound.  Liver/spleen:  No organomegaly noted  Hernia:  None appreciated  Skin  Inspection:  Grossly normal Breasts: Examined lying and sitting.   Right: Without masses, retractions, nipple  discharge or axillary adenopathy.   Left: Without masses, retractions, nipple discharge or axillary adenopathy. Genitourinary   Inguinal/mons:  Normal without inguinal adenopathy  External genitalia:  Normal appearing vulva with no masses, tenderness, or lesions  BUS/Urethra/Skene's glands:  Normal  Vagina:  Normal appearing with normal color and discharge, no lesions. Atrophy moderate  Cervix:  without lesions or discharge  Uterus:  absent  Adnexa/parametria:     Rt: Normal in size, without masses or tenderness.   Lt: Normal in size, without masses or tenderness.  Anus and perineum: Normal   Ellis Guys, CMA present for exam  Assessment/Plan:   1. Well woman exam with routine gynecological exam (Primary) Pap 2027  2. 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  Dispense: 24 patch; Refill: 4  3. Colon cancer screening - Cologuard  4. Vaginal atrophy - Estradiol  (YUVAFEM ) 10 MCG TABS vaginal tablet; Place 1 tablet (10 mcg total) vaginally 2 (two) times a week.  Dispense: 8 tablet; Refill: 11   Return in 1 year for annual or sooner prn.  Synetta Eves B WHNP-BC 3:37 PM 11/10/2023

## 2023-11-12 ENCOUNTER — Telehealth: Payer: Self-pay

## 2023-11-12 ENCOUNTER — Other Ambulatory Visit: Payer: Self-pay | Admitting: Radiology

## 2023-11-12 DIAGNOSIS — N952 Postmenopausal atrophic vaginitis: Secondary | ICD-10-CM

## 2023-11-12 MED ORDER — IMVEXXY MAINTENANCE PACK 10 MCG VA INST: 1.0000 | VAGINAL_INSERT | VAGINAL | 11 refills | Status: AC

## 2023-11-12 NOTE — Telephone Encounter (Signed)
 Will send imvexxy  to myscripts #8 with 11 refills. Please notify patient of change. Thanks.

## 2023-11-12 NOTE — Telephone Encounter (Signed)
 Patient notified via My Chart message.

## 2023-11-12 NOTE — Telephone Encounter (Signed)
 Prior authorization request received for estradiol  10mcg vaginal tablets. Estradiol  10mcg vaginal tablets is not on the patient's insurance formulary. Per PA request: Available Formulary Alternatives: estradiol  vaginal cream, Imvexxy , Vagifem  Please advise.

## 2023-11-12 NOTE — Telephone Encounter (Signed)
 Prior authorization request received for estradiol  10mcg vaginal tablets.  PA initiated.  Patient notified. KEY: [id number] Dx: N58.2

## 2023-11-24 ENCOUNTER — Encounter: Payer: Self-pay | Admitting: Family Medicine

## 2023-11-24 ENCOUNTER — Ambulatory Visit: Admitting: Family Medicine

## 2023-11-24 VITALS — BP 108/70 | HR 60 | Temp 97.8°F | Ht 66.0 in | Wt 130.1 lb

## 2023-11-24 DIAGNOSIS — F330 Major depressive disorder, recurrent, mild: Secondary | ICD-10-CM

## 2023-11-24 DIAGNOSIS — G43709 Chronic migraine without aura, not intractable, without status migrainosus: Secondary | ICD-10-CM

## 2023-11-24 DIAGNOSIS — F419 Anxiety disorder, unspecified: Secondary | ICD-10-CM

## 2023-11-24 MED ORDER — ESCITALOPRAM OXALATE 10 MG PO TABS: 10.0000 mg | ORAL_TABLET | Freq: Every day | ORAL | 1 refills | Status: DC

## 2023-11-24 NOTE — Progress Notes (Signed)
 Established patient visit   Patient: Joanna Cervantes   DOB: 1970-08-10   53 y.o. Female  MRN: [id number] Visit Date: 11/24/2023  Today's healthcare provider: Carlean Charter, DO   Chief Complaint  Patient presents with   Medical Management of Chronic Issues    Patient last seen 10/13/23 for migraines and was prescribed sumatriptan  50 mg, phenergan  at bedtime for nausea and to induce sleep, and propranolol  once daily for migraine prevention. She was also advised to discuss management of depression and anxiety with Dr. Athena Bland as she felt effexor  may no longer be effective, as indicated by emotional distress and crying episodes.   Depression    Joanna Cervantes is a 53 y.o. female who presents for follow up of depression. Current symptoms include depressed mood, difficulty concentrating, fatigue, and hopelessness. Symptoms have been gradually worsening since that time. Previous treatment includes: individual therapy and medication.    Anxiety    Joanna Cervantes presents for follow up of anxiety disorder. Current symptoms: chest pain, difficulty concentrating, fatigue, feelings of losing control, insomnia, irritable, psychomotor agitation, racing thoughts, sweating. She denies current suicidal and homicidal ideation.    Migraine    Patient reports taking medications as prescribed, tolerating well with no side effects. She reports migraines have improved since her last visit almost 90%.   Subjective    Depression        Associated symptoms include fatigue and headaches.  Past medical history includes anxiety.   Anxiety Symptoms include nervous/anxious behavior.    Migraine     Joanna Cervantes "Joanna Cervantes" is a 53 year old female with migraines, depression, and anxiety who presents for follow-up on her migraines and mental health management.  She has experienced significant improvement in her migraines since the last visit, with only two episodes occurring, one of which was nocturnal. She  took her medication upon waking and was symptom-free four hours later. The medication causes her limbs to feel heavy for about an hour, but she manages this side effect. No nausea or lingering symptoms post-migraine.  She has a long-standing history of clinical depression, diagnosed at age 40, and has been on venlafaxine  for several years. She feels that the medication is no longer effective, as her depression symptoms have escalated. Her husband has noticed changes, including fatigue, irritability, and emotional distress, which have impacted her relationship. She also experiences sleep disturbances, waking up with racing thoughts. She takes trazodone  for sleep and Klonopin  as needed for anxiety, which she finds helpful. She has a history of a negative reaction to Wellbutrin, which caused hives and swelling.  She continues to see her counselor monthly, although her last session was six weeks ago due to the counselor being out of state.      Medications: Outpatient Medications Prior to Visit  Medication Sig   albuterol  (VENTOLIN  HFA) 108 (90 Base) MCG/ACT inhaler Inhale 2 puffs into the lungs every 6 (six) hours as needed for wheezing or shortness of breath.   Ascorbic Acid (VITAMIN C PO) Take by mouth.   clonazePAM  (KLONOPIN ) 0.5 MG tablet Take 1 tablet (0.5 mg total) by mouth 3 (three) times daily as needed for anxiety.   Cyanocobalamin (B-12 PO) Take by mouth.   estradiol  (DOTTI ) 0.075 MG/24HR Place 1 patch onto the skin 2 (two) times a week.   Estradiol  (IMVEXXY  MAINTENANCE PACK) 10 MCG INST Place 1 tablet vaginally 2 (two) times a week.   ibuprofen (ADVIL,MOTRIN) 200 MG tablet Take 400 mg  by mouth every 6 (six) hours as needed.   MAGNESIUM PO Take by mouth.   Multiple Vitamins-Minerals (CENTRUM SILVER PO) Take by mouth.   ondansetron  (ZOFRAN -ODT) 4 MG disintegrating tablet Take 1 tablet (4 mg total) by mouth every 8 (eight) hours as needed for nausea or vomiting.   promethazine  (PHENERGAN )  25 MG tablet Take 0.5-1 tablets (12.5-25 mg total) by mouth every 8 (eight) hours as needed for nausea or vomiting (migraine).   propranolol  ER (INDERAL  LA) 60 MG 24 hr capsule Take 1 capsule (60 mg total) by mouth daily.   Pyridoxine HCl (VITAMIN B-6 PO) Take by mouth.   SUMAtriptan  (IMITREX ) 50 MG tablet Take 1 tablet (50 mg total) by mouth every 2 (two) hours as needed for migraine. May repeat in 2 hours if headache persists or recurs.   traZODone  (DESYREL ) 50 MG tablet Take 1 tablet (50 mg total) by mouth at bedtime as needed for sleep.   venlafaxine  XR (EFFEXOR -XR) 150 MG 24 hr capsule Take 1 capsule (150 mg total) by mouth daily with breakfast. Take with 75 mg (total 225 mg)   venlafaxine  XR (EFFEXOR -XR) 75 MG 24 hr capsule Take 1 capsule (75 mg total) by mouth daily with breakfast. Take with 150 mg (total 225 mg)   VITAMIN D  PO Take by mouth.   [DISCONTINUED] ipratropium (ATROVENT ) 0.03 % nasal spray Place 2 sprays into both nostrils every 12 (twelve) hours. (Patient not taking: Reported on 11/10/2023)   No facility-administered medications prior to visit.    Review of Systems  Constitutional:  Positive for fatigue.  Neurological:  Positive for headaches.  Psychiatric/Behavioral:  Positive for agitation, depression, dysphoric mood and sleep disturbance. The patient is nervous/anxious.         Objective    BP 108/70 (BP Location: Right Arm, Patient Position: Sitting, Cuff Size: Normal)   Pulse 60   Temp 97.8 F (36.6 C) (Oral)   Ht 5\' 6"  (1.676 m)   Wt 130 lb 1.6 oz (59 kg)   SpO2 99%   BMI 21.00 kg/m     Physical Exam Vitals and nursing note reviewed.  Constitutional:      General: She is not in acute distress.    Appearance: Normal appearance.  HENT:     Head: Normocephalic and atraumatic.  Eyes:     General: No scleral icterus.    Conjunctiva/sclera: Conjunctivae normal.  Cardiovascular:     Rate and Rhythm: Normal rate.  Pulmonary:     Effort: Pulmonary effort  is normal.  Neurological:     Mental Status: She is alert and oriented to person, place, and time. Mental status is at baseline.  Psychiatric:        Mood and Affect: Mood normal.        Behavior: Behavior normal.      No results found for any visits on 11/24/23.  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  Dispense: 30 tablet; Refill: 1  Chronic migraine without aura without status migrainosus, not intractable      Major depressive disorder, recurrent, mild Recurrent mild depression with irritability, fatigue, and sleep disturbances. Venlafaxine  becoming ineffective, adverse reaction to Wellbutrin. Considering escitalopram . Discussed gene panel testing for medication metabolism. - Taper venlafaxine : 150 mg for one week, then 75 mg for one week. - Initiate escitalopram  during the second week of venlafaxine  taper. -  Continue trazodone  for sleep adjunct. - Schedule follow-up in one month to assess response to medication change.  Generalized anxiety disorder Anxiety managed with Klonopin  for sleep disturbances. Continues monthly counseling. - Continue Klonopin  as needed for sleep. - Continue monthly counseling sessions.  Migraine Significant improvement in migraine frequency and severity with current medication. Effective management of acute episodes with minimal side effects.  General Health Maintenance Discussed COVID-19 booster and shingles vaccination. Declined further COVID-19 vaccinations, open to shingles vaccine. Discussed risks of shingles. - Recommend shingles vaccination, available at the clinic or pharmacy.    Return in about 1 month (around 12/25/2023) for Anx/Dep.      I discussed the assessment and treatment plan with the patient  The patient was provided an opportunity to ask questions and all were answered. The patient agreed with the plan and  demonstrated an understanding of the instructions.   The patient was advised to call back or seek an in-person evaluation if the symptoms worsen or if the condition fails to improve as anticipated.    Carlean Charter, DO  [facility] [phone] (phone) [phone] (fax)  [facility]

## 2023-11-24 NOTE — Patient Instructions (Addendum)
 Take 150 mg venlafaxine  daily for one week. Take 75 mg venlafaxine  daily for one week.  - start escitalopram 10 mg daily in the middle this week.

## 2023-12-05 ENCOUNTER — Other Ambulatory Visit: Payer: Self-pay | Admitting: Family Medicine

## 2023-12-05 LAB — COLOGUARD: COLOGUARD: NEGATIVE

## 2023-12-07 ENCOUNTER — Ambulatory Visit: Payer: Self-pay | Admitting: Obstetrics and Gynecology

## 2023-12-29 ENCOUNTER — Ambulatory Visit: Admitting: Family Medicine

## 2023-12-29 ENCOUNTER — Encounter: Payer: Self-pay | Admitting: Family Medicine

## 2023-12-29 VITALS — BP 101/55 | HR 63 | Ht 66.0 in | Wt 131.0 lb

## 2023-12-29 DIAGNOSIS — M79606 Pain in leg, unspecified: Secondary | ICD-10-CM

## 2023-12-29 DIAGNOSIS — J3081 Allergic rhinitis due to animal (cat) (dog) hair and dander: Secondary | ICD-10-CM | POA: Diagnosis not present

## 2023-12-29 DIAGNOSIS — F419 Anxiety disorder, unspecified: Secondary | ICD-10-CM

## 2023-12-29 DIAGNOSIS — F5104 Psychophysiologic insomnia: Secondary | ICD-10-CM

## 2023-12-29 DIAGNOSIS — F330 Major depressive disorder, recurrent, mild: Secondary | ICD-10-CM | POA: Diagnosis not present

## 2023-12-29 DIAGNOSIS — F410 Panic disorder [episodic paroxysmal anxiety] without agoraphobia: Secondary | ICD-10-CM

## 2023-12-29 DIAGNOSIS — F4310 Post-traumatic stress disorder, unspecified: Secondary | ICD-10-CM

## 2023-12-29 DIAGNOSIS — G8929 Other chronic pain: Secondary | ICD-10-CM

## 2023-12-29 DIAGNOSIS — M25559 Pain in unspecified hip: Secondary | ICD-10-CM

## 2023-12-29 MED ORDER — QUETIAPINE FUMARATE 25 MG PO TABS: ORAL_TABLET | ORAL | 1 refills | Status: DC

## 2023-12-29 NOTE — Progress Notes (Unsigned)
 Established patient visit   Patient: Joanna Cervantes   DOB: Oct 21, 1970   53 y.o. Female  MRN: [id number] Visit Date: 12/29/2023  Today's healthcare provider: Carlean Charter, DO   Chief Complaint  Patient presents with   Follow-up   Subjective    HPI  Started escitalopram  roughly 5/22-5/23 - has been on for about 4.5 weeks  - common adverse effect -> Gastrointestinal: Diarrhea (6% to 14%), nausea (15% to 18%)    Left hip/leg sciatica (w/back pain?) ***  {History (Optional):23778}  Medications: Outpatient Medications Prior to Visit  Medication Sig   albuterol  (VENTOLIN  HFA) 108 (90 Base) MCG/ACT inhaler Inhale 2 puffs into the lungs every 6 (six) hours as needed for wheezing or shortness of breath.   Ascorbic Acid (VITAMIN C PO) Take by mouth.   clonazePAM  (KLONOPIN ) 0.5 MG tablet Take 1 tablet (0.5 mg total) by mouth 3 (three) times daily as needed for anxiety.   Cyanocobalamin (B-12 PO) Take by mouth.   escitalopram  (LEXAPRO ) 10 MG tablet Take 1 tablet (10 mg total) by mouth daily.   estradiol  (DOTTI ) 0.075 MG/24HR Place 1 patch onto the skin 2 (two) times a week.   Estradiol  (IMVEXXY  MAINTENANCE PACK) 10 MCG INST Place 1 tablet vaginally 2 (two) times a week.   ibuprofen (ADVIL,MOTRIN) 200 MG tablet Take 400 mg by mouth every 6 (six) hours as needed.   MAGNESIUM PO Take by mouth.   Multiple Vitamins-Minerals (CENTRUM SILVER PO) Take by mouth.   ondansetron  (ZOFRAN -ODT) 4 MG disintegrating tablet Take 1 tablet (4 mg total) by mouth every 8 (eight) hours as needed for nausea or vomiting.   promethazine  (PHENERGAN ) 25 MG tablet Take 0.5-1 tablets (12.5-25 mg total) by mouth every 8 (eight) hours as needed for nausea or vomiting (migraine).   propranolol  ER (INDERAL  LA) 60 MG 24 hr capsule Take 1 capsule (60 mg total) by mouth daily.   Pyridoxine HCl (VITAMIN B-6 PO) Take by mouth.   SUMAtriptan  (IMITREX ) 50 MG tablet Take 1 tablet (50 mg total) by mouth every 2  (two) hours as needed for migraine. May repeat in 2 hours if headache persists or recurs.   traZODone  (DESYREL ) 50 MG tablet Take 1 tablet (50 mg total) by mouth at bedtime as needed for sleep.   VITAMIN D  PO Take by mouth.   venlafaxine  XR (EFFEXOR -XR) 150 MG 24 hr capsule Take 1 capsule (150 mg total) by mouth daily with breakfast. Take with 75 mg (total 225 mg)   venlafaxine  XR (EFFEXOR -XR) 75 MG 24 hr capsule Take 1 capsule (75 mg total) by mouth daily with breakfast. Take with 150 mg (total 225 mg)   No facility-administered medications prior to visit.    Review of Systems ***  {Insert previous labs (optional):23779} {See past labs  Heme  Chem  Endocrine  Serology  Results Review (optional):1}   Objective    BP (!) 101/55 (BP Location: Left Arm, Patient Position: Sitting, Cuff Size: Normal)   Pulse 63   Ht 5' 6 (1.676 m)   Wt 131 lb (59.4 kg)   SpO2 100%   BMI 21.14 kg/m  {Insert last BP/Wt (optional):23777}{See vitals history (optional):1}   Physical Exam   No results found for any visits on 12/29/23.  Assessment & Plan    There are no diagnoses linked to this encounter.  ***  No follow-ups on file.      I discussed the assessment and treatment plan with the patient  The patient was provided an opportunity to ask questions and all were answered. The patient agreed with the plan and demonstrated an understanding of the instructions.   The patient was advised to call back or seek an in-person evaluation if the symptoms worsen or if the condition fails to improve as anticipated.    Carlean Charter, DO  [facility] [phone] (phone) [phone] (fax)  [facility]

## 2023-12-29 NOTE — Patient Instructions (Signed)
 Start tapering the escitalopram  by week 2 (can be beginning or end).  - take one tablet every other day for three doses.

## 2024-01-04 ENCOUNTER — Other Ambulatory Visit: Payer: Self-pay | Admitting: Family Medicine

## 2024-01-04 DIAGNOSIS — G43709 Chronic migraine without aura, not intractable, without status migrainosus: Secondary | ICD-10-CM

## 2024-01-25 ENCOUNTER — Encounter: Payer: Self-pay | Admitting: Family Medicine

## 2024-01-25 DIAGNOSIS — F4310 Post-traumatic stress disorder, unspecified: Secondary | ICD-10-CM

## 2024-01-25 DIAGNOSIS — F330 Major depressive disorder, recurrent, mild: Secondary | ICD-10-CM

## 2024-01-25 DIAGNOSIS — F5104 Psychophysiologic insomnia: Secondary | ICD-10-CM

## 2024-01-27 MED ORDER — DESVENLAFAXINE SUCCINATE ER 50 MG PO TB24: 50.0000 mg | ORAL_TABLET | Freq: Every day | ORAL | 0 refills | Status: DC

## 2024-02-10 ENCOUNTER — Ambulatory Visit: Admitting: Family Medicine

## 2024-02-10 ENCOUNTER — Encounter: Payer: Self-pay | Admitting: Family Medicine

## 2024-02-10 VITALS — BP 99/57 | HR 52 | Ht 66.0 in | Wt 131.0 lb

## 2024-02-10 DIAGNOSIS — Z23 Encounter for immunization: Secondary | ICD-10-CM

## 2024-02-10 DIAGNOSIS — F330 Major depressive disorder, recurrent, mild: Secondary | ICD-10-CM

## 2024-02-10 DIAGNOSIS — F4310 Post-traumatic stress disorder, unspecified: Secondary | ICD-10-CM

## 2024-02-10 DIAGNOSIS — F419 Anxiety disorder, unspecified: Secondary | ICD-10-CM

## 2024-02-10 DIAGNOSIS — G43709 Chronic migraine without aura, not intractable, without status migrainosus: Secondary | ICD-10-CM | POA: Diagnosis not present

## 2024-02-10 DIAGNOSIS — F5104 Psychophysiologic insomnia: Secondary | ICD-10-CM

## 2024-02-10 DIAGNOSIS — F410 Panic disorder [episodic paroxysmal anxiety] without agoraphobia: Secondary | ICD-10-CM

## 2024-02-10 MED ORDER — DESVENLAFAXINE SUCCINATE ER 50 MG PO TB24: 50.0000 mg | ORAL_TABLET | Freq: Every day | ORAL | 3 refills | Status: DC

## 2024-02-10 MED ORDER — CLONAZEPAM 0.5 MG PO TABS: 0.5000 mg | ORAL_TABLET | Freq: Three times a day (TID) | ORAL | 0 refills | Status: AC | PRN

## 2024-02-10 NOTE — Progress Notes (Signed)
 Established patient visit   Patient: Joanna Cervantes   DOB: 1970/08/06   53 y.o. Female  MRN: [id number] Visit Date: 02/10/2024  Today's healthcare provider: LAURAINE LOISE BUOY, DO   Chief Complaint  Patient presents with   Medical Management of Chronic Issues    Patient was given rx for QUEtiapine  Fumarate; Week 1: Take 1 tablet daily.  Week 2: Take 2 tablets daily.  Week 3: Take 3 tablets daily.  Week 4: Take 4 tablets daily.  If unable to tolerate higher dose, stay at maximum tolerated dose   Anxiety and Depression    She reports she is taking 1 tablet day. She reports excellent compliance with treatment. She reports excellent tolerance of treatment. She is not having side effects. She feels her anxiety is moderate and Improved since last visit. She reports same for depression and no symptoms to report.   Subjective    HPI Joanna Cervantes is a 52 year old female who presents for medication management and follow-up.  She is tapering off Seroquel  and currently taking Pristiq , which she feels is effective. Previously, she was on escitalopram , for short period which was not effective, and venlafaxine  for about ten years. She notes improved sleep and focus, and recently managed a week of single parenting, which she feels she could not have done a month ago. Her anxiety is mild to moderate, having been worse before. A recent distressing situation with a friend is now improving.  She reports an allergic flare-up coinciding with her medication change, affecting her right eye. Allergy testing is scheduled for September. She also experiences neuropathy-like symptoms in her feet and wrists, particularly at night.  She has a history of migraines and experienced a severe migraine last Saturday, managed with medication, coffee, and rest. She had been doing well with migraines for a couple of months prior. She is on propranolol  for prevention and uses Imitrex  for acute attacks, along  with Zofran  for nausea.  She mentions chronic fatigue since having COVID-19 in early 2020, which persists. No chest pain, dizziness, feelings of losing control, irritability, panic attacks, shortness of breath, tremors, or shaking. Sweating occurs only when outside.  Her current medications include Pristiq , propranolol , trazodone , estradiol , Imitrex , and clonazepam , which she uses rarely for severe anxiety. She has about six or seven clonazepam  tablets left and uses them as a 'safety blanket' for severe panic attacks.   Anxiety, Follow-up  She was last seen for anxiety 5 weeks ago. Changes made at last visit include transitioned to desvenlafaxine .   She reports excellent compliance with treatment. She reports good tolerance of treatment. She is not having side effects.   She feels her anxiety is mild to moderate and Improved since last visit.  Symptoms: No chest pain Yes difficulty concentrating, but improved  No dizziness No fatigue  No feelings of losing control Yes insomnia, but improved  No irritable No palpitations  No panic attacks No racing thoughts  No shortness of breath No sweating  No tremors/shakes    GAD-7 Results    02/10/2024   12:00 PM 12/29/2023    2:22 PM 11/24/2023   11:12 AM  GAD-7 Generalized Anxiety Disorder Screening Tool  1. Feeling Nervous, Anxious, or on Edge 1 1 2   2. Not Being Able to Stop or Control Worrying 0 0 1  3. Worrying Too Much About Different Things 1 0 1  4. Trouble Relaxing 2 3 3   5. Being So Restless it's  Hard To Sit Still 1 2 2   6. Becoming Easily Annoyed or Irritable 0 2 2  7. Feeling Afraid As If Something Awful Might Happen 0 0 0  Total GAD-7 Score 5 8 11   Difficulty At Work, Home, or Getting  Along With Others? Somewhat difficult Very difficult Somewhat difficult    PHQ-9 Scores    02/10/2024   11:59 AM 12/29/2023    2:22 PM 11/24/2023   11:12 AM  PHQ9 SCORE ONLY  PHQ-9 Total Score 2 10 9      --------------------------------------------------------------------------------------------------     Medications: Outpatient Medications Prior to Visit  Medication Sig   albuterol  (VENTOLIN  HFA) 108 (90 Base) MCG/ACT inhaler Inhale 2 puffs into the lungs every 6 (six) hours as needed for wheezing or shortness of breath.   Ascorbic Acid (VITAMIN C PO) Take by mouth.   Cyanocobalamin (B-12 PO) Take by mouth.   estradiol  (DOTTI ) 0.075 MG/24HR Place 1 patch onto the skin 2 (two) times a week.   Estradiol  (IMVEXXY  MAINTENANCE PACK) 10 MCG INST Place 1 tablet vaginally 2 (two) times a week.   ibuprofen (ADVIL,MOTRIN) 200 MG tablet Take 400 mg by mouth every 6 (six) hours as needed.   MAGNESIUM PO Take by mouth.   Multiple Vitamins-Minerals (CENTRUM SILVER PO) Take by mouth.   ondansetron  (ZOFRAN -ODT) 4 MG disintegrating tablet Take 1 tablet (4 mg total) by mouth every 8 (eight) hours as needed for nausea or vomiting.   promethazine  (PHENERGAN ) 25 MG tablet Take 0.5-1 tablets (12.5-25 mg total) by mouth every 8 (eight) hours as needed for nausea or vomiting (migraine).   propranolol  ER (INDERAL  LA) 60 MG 24 hr capsule TAKE 1 CAPSULE BY MOUTH ONCE DAILY   Pyridoxine HCl (VITAMIN B-6 PO) Take by mouth.   SUMAtriptan  (IMITREX ) 50 MG tablet Take 1 tablet (50 mg total) by mouth every 2 (two) hours as needed for migraine. May repeat in 2 hours if headache persists or recurs.   traZODone  (DESYREL ) 50 MG tablet Take 1 tablet (50 mg total) by mouth at bedtime as needed for sleep.   VITAMIN D  PO Take by mouth.   [DISCONTINUED] clonazePAM  (KLONOPIN ) 0.5 MG tablet Take 1 tablet (0.5 mg total) by mouth 3 (three) times daily as needed for anxiety.   [DISCONTINUED] desvenlafaxine  (PRISTIQ ) 50 MG 24 hr tablet Take 1 tablet (50 mg total) by mouth daily.   [DISCONTINUED] QUEtiapine  (SEROQUEL ) 25 MG tablet Week 1: Take 1 tablet daily.  Week 2: Take 2 tablets daily.  Week 3: Take 3 tablets daily.  Week 4: Take  4 tablets daily.  If unable to tolerate higher dose, stay at maximum tolerated dose.   No facility-administered medications prior to visit.        Objective    BP (!) 99/57 (BP Location: Right Arm, Patient Position: Sitting)   Pulse (!) 52   Ht 5' 6 (1.676 m)   Wt 131 lb (59.4 kg)   SpO2 99%   BMI 21.14 kg/m     Physical Exam Vitals and nursing note reviewed.  Constitutional:      General: She is not in acute distress.    Appearance: Normal appearance.  HENT:     Head: Normocephalic and atraumatic.  Eyes:     General: No scleral icterus.    Conjunctiva/sclera: Conjunctivae normal.  Cardiovascular:     Rate and Rhythm: Normal rate.  Pulmonary:     Effort: Pulmonary effort is normal.  Neurological:     Mental Status:  She is alert and oriented to person, place, and time. Mental status is at baseline.  Psychiatric:        Mood and Affect: Mood normal.        Behavior: Behavior normal.      No results found for any visits on 02/10/24.  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  Dispense: 90 tablet; Refill: 3  Panic attack due to post traumatic stress disorder (PTSD) -     clonazePAM ; Take 1 tablet (0.5 mg total) by mouth 3 (three) times daily as needed for anxiety.  Dispense: 60 tablet; Refill: 0 -     Desvenlafaxine  Succinate ER; Take 1 tablet (50 mg total) by mouth daily.  Dispense: 90 tablet; Refill: 3  Chronic migraine without aura without status migrainosus, not intractable  Psychophysiological insomnia -     Desvenlafaxine  Succinate ER; Take 1 tablet (50 mg total) by mouth daily.  Dispense: 90 tablet; Refill: 3  Hepatitis B vaccination administered at current visit -     Heplisav-B  (HepB-CPG) Vaccine  Need for shingles vaccine -     Varicella-zoster vaccine IM     Generalized anxiety disorder with panic attacks; major depressive  disorder Symptoms mild to moderate, controlled on current medication. Clonazepam  used rarely for severe panic attacks. Stressors include work and personal relationships. - Continue Pristiq . - Prescribe 60 clonazepam  tablets for severe panic attacks. - Send 90-day supply of venlafaxine  with refills. - Schedule follow-up for annual exam.  Migraine Well-controlled with propranolol . Recent severe migraine managed with sumatriptan  and anti-nausea medication.  - Continue propranolol . - Use sumatriptan  at migraine onset. - Ensure Zofran  availability for nausea.  Chronic fatigue following COVID-19 infection; psychophysiologic insomnia Persistent fatigue since early 2020, not associated with acute symptoms.  Peripheral neuropathy, feet and wrists (under evaluation) Symptoms suggestive of peripheral neuropathy, primarily nocturnal. Possible inflammation-related. - Proceed with scheduled chiropractic adjustment.  Menopausal symptoms Well-controlled with estradiol . - Continue estradiol  (Dotti ).  Allergic conjunctivitis, right eye (suspected) Suspected allergic conjunctivitis with previous similar episodes. Allergy testing scheduled. - Proceed with allergy testing in September.    Return in about 2 months (around 04/12/2024) for CPE, vaccines.      I discussed the assessment and treatment plan with the patient  The patient was provided an opportunity to ask questions and all were answered. The patient agreed with the plan and demonstrated an understanding of the instructions.   The patient was advised to call back or seek an in-person evaluation if the symptoms worsen or if the condition fails to improve as anticipated.    LAURAINE LOISE BUOY, DO  [facility] [phone] (phone) [phone] (fax)  [facility]

## 2024-02-17 ENCOUNTER — Encounter: Payer: Self-pay | Admitting: Family Medicine

## 2024-03-27 ENCOUNTER — Other Ambulatory Visit: Payer: Self-pay | Admitting: Family Medicine

## 2024-03-27 DIAGNOSIS — G43709 Chronic migraine without aura, not intractable, without status migrainosus: Secondary | ICD-10-CM

## 2024-04-15 ENCOUNTER — Ambulatory Visit: Admitting: Family Medicine

## 2024-04-15 ENCOUNTER — Encounter: Payer: Self-pay | Admitting: Family Medicine

## 2024-04-15 VITALS — BP 104/68 | HR 62 | Resp 16 | Ht 66.0 in | Wt 135.9 lb

## 2024-04-15 DIAGNOSIS — Z0001 Encounter for general adult medical examination with abnormal findings: Secondary | ICD-10-CM | POA: Diagnosis not present

## 2024-04-15 DIAGNOSIS — J40 Bronchitis, not specified as acute or chronic: Secondary | ICD-10-CM | POA: Diagnosis not present

## 2024-04-15 DIAGNOSIS — Z23 Encounter for immunization: Secondary | ICD-10-CM | POA: Diagnosis not present

## 2024-04-15 DIAGNOSIS — F5104 Psychophysiologic insomnia: Secondary | ICD-10-CM

## 2024-04-15 DIAGNOSIS — G43709 Chronic migraine without aura, not intractable, without status migrainosus: Secondary | ICD-10-CM

## 2024-04-15 DIAGNOSIS — J302 Other seasonal allergic rhinitis: Secondary | ICD-10-CM

## 2024-04-15 DIAGNOSIS — Z0000 Encounter for general adult medical examination without abnormal findings: Secondary | ICD-10-CM

## 2024-04-15 MED ORDER — CETIRIZINE HCL 10 MG PO TABS: 5.0000 mg | ORAL_TABLET | Freq: Every day | ORAL | 3 refills | Status: AC

## 2024-04-15 MED ORDER — ALBUTEROL SULFATE HFA 108 (90 BASE) MCG/ACT IN AERS: 2.0000 | INHALATION_SPRAY | Freq: Four times a day (QID) | RESPIRATORY_TRACT | 2 refills | Status: AC | PRN

## 2024-04-15 MED ORDER — PROPRANOLOL HCL ER 120 MG PO CP24: 120.0000 mg | ORAL_CAPSULE | Freq: Every day | ORAL | 3 refills | Status: AC

## 2024-04-15 NOTE — Progress Notes (Signed)
 Complete physical exam   Patient: Joanna Cervantes   DOB: 03/01/1971   53 y.o. Female  MRN: [id number] Visit Date: 04/15/2024  Today's healthcare provider: LAURAINE LOISE BUOY, DO   Chief Complaint  Patient presents with   Annual Exam   Subjective     HPI  Joanna Cervantes is a 53 y.o. female who presents today for a complete physical exam.  She reports consuming a general diet. She does exercise regularly. She generally feels well. She reports sleeping fairly well with some recent difficulty getting up in the morning, leading her to taper her trazodone  back down to a half tablet.. She does have additional problems to discuss today.   She also has been experiencing increasing frequency and severity of migraines, with nausea as the primary indicator of an impending migraine.  Increased propanolol to two tablets daily in the past week - improved nausea/migraine - no more in past four days. Nausea precedes migraines  She has been slowly tapering down trazodone .  Otherwise no concerns.    Past Medical History:  Diagnosis Date   Abnormal Pap smear of cervix    Allergy    Anxiety    Bulging of cervical intervertebral disc    sees chiropractor for alignment monthly   Depression    Endometriosis    s/p partial hysterectomy 2013   Family history of adverse reaction to anesthesia    Father - PONV   History of chicken pox    Irregular menses    Migraine headache    4-6x/yr   Motion sickness    back seat of car   PONV (postoperative nausea and vomiting)    severe   Vertigo    3 episodes.  last one approx 2 months ago   Past Surgical History:  Procedure Laterality Date   ABDOMINAL HYSTERECTOMY  11/2011   not due to cancer; Endometriosis and Ovarian cyst. Still has cervix   APPENDECTOMY  08/13/2011   Diagnostic Laproscopy, prophylactic appendectomy, and drainage of right ovarian cyst done by Dr. Dessa at [facility]   BILATERAL SALPINGOOPHORECTOMY     BREAST ENHANCEMENT  SURGERY  2000   reduction - DR. MORRISON   CERVIX LESION DESTRUCTION     COLPOSCOPY  02/20/2003   ENDOMETRIAL ABLATION  09/10/2006   BVD   HYSTEROSCOPY  09/10/2006   D&C; WITH NOVASURE AND TUBAL LIGATION; BVD   LAPAROSCOPIC SUPRACERVICAL HYSTERECTOMY     LARYNGOSCOPY  11/03/2006   DR MCQUEEN, SMALL CYSTIC AREA OF THE RIGHT ANTERIOR VOCAL FOLD.   LEEP  2004   MM BREAST STEREO BIOPSY LEFT ([facility] HX)  08/13/2009   REDUCTION MAMMAPLASTY Bilateral 2000   REFRACTIVE SURGERY Left 08/14/2022   lasik repair   SHOULDER ARTHROSCOPY Right 05/06/2017   Procedure: Limited arthroscopic debridement, arthroscopic subacromial decompression, mini-open rotator cuff repair, and mini-open biceps tenodesis, right shoulder;  Surgeon: Edie Norleen JINNY, MD;  Location: [facility];  Service: Orthopedics;  Laterality: Right;   TUBAL LIGATION  2008   Laporoscopic tubal ligation by cautery   TUBAL LIGATION  09/10/2006   BVD   Social History   Socioeconomic History   Marital status: Married    Spouse name: Not on file   Number of children: 0   Years of education: Not on file   Highest education level: Bachelor's degree (e.g., BA, AB, BS)  Occupational History   Not on file  Tobacco Use   Smoking status: Former    Current packs/day:  0.00    Average packs/day: 0.5 packs/day for 10.0 years (5.0 ttl pk-yrs)    Types: Cigarettes    Quit date: 07/15/2007    Years since quitting: 16.7    Passive exposure: Never   Smokeless tobacco: Never  Vaping Use   Vaping status: Never Used  Substance and Sexual Activity   Alcohol use: Yes    Alcohol/week: 4.0 standard drinks of alcohol    Types: 4 Glasses of wine per week    Comment: occasional use   Drug use: No   Sexual activity: Yes    Partners: Male    Birth control/protection: None    Comment: 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, hysterectomy, menarche 53yo, sexual debut 53yo  Other Topics Concern   Not on file  Social History Narrative   Not on file   Social  Drivers of Health   Financial Resource Strain: Low Risk  (12/27/2023)   Overall Financial Resource Strain (CARDIA)    Difficulty of Paying Living Expenses: Not hard at all  Food Insecurity: No Food Insecurity (12/27/2023)   Hunger Vital Sign    Worried About Running Out of Food in the Last Year: Never true    Ran Out of Food in the Last Year: Never true  Transportation Needs: No Transportation Needs (12/27/2023)   PRAPARE - Administrator, Civil Service (Medical): No    Lack of Transportation (Non-Medical): No  Physical Activity: Sufficiently Active (12/27/2023)   Exercise Vital Sign    Days of Exercise per Week: 5 days    Minutes of Exercise per Session: 30 min  Stress: Stress Concern Present (12/27/2023)   [employer] of Occupational Health - Occupational Stress Questionnaire    Feeling of Stress: To some extent  Social Connections: Socially Integrated (12/27/2023)   Social Connection and Isolation Panel    Frequency of Communication with Friends and Family: Three times a week    Frequency of Social Gatherings with Friends and Family: Twice a week    Attends Religious Services: More than 4 times per year    Active Member of [employer] or Organizations: Yes    Attends Engineer, structural: More than 4 times per year    Marital Status: Married  Catering manager Violence: Not on file   Family Status  Relation Name Status   Mother Glendale Nam Alive   Father Jayson Nam Alive   Sister  Alive   Brother Richard Bartrug Alive   MGF Gene Yost Deceased   PGF  Deceased   Neg Hx  (Not Specified)  No partnership data on file   Family History  Problem Relation Age of Onset   Hypertension Mother    Hypertension Father    Other Father        C-diff   Diabetes Brother    Heart attack Maternal Grandfather    Parkinson's disease Maternal Grandfather    Cancer Maternal Grandfather        PROSTATE   Heart attack Paternal Grandfather    Lung cancer Paternal  Grandfather    Breast cancer Neg Hx    Allergies  Allergen Reactions   Mushroom Extract Complex (Obsolete) Swelling    throat   Buspirone  Other (See Comments)    Vertigo, dizziness   Aspirin Hives and Other (See Comments)   Celecoxib  Diarrhea   Bupropion Rash, Swelling and Other (See Comments)    Patient Care Team: Donzella Lauraine SAILOR, DO as PCP - General (Family Medicine) Fleeta Milks, Lamar ORN, MD (  Unknown Physician Specialty) Cheryl Lawson Chrzanowski, Jami B, NP as Nurse Practitioner (Radiology)   Medications: Outpatient Medications Prior to Visit  Medication Sig   Ascorbic Acid (VITAMIN C PO) Take by mouth.   Azelastine HCl 137 MCG/SPRAY SOLN 1 spray in each nostril Nasally Twice a day; Duration: 30 days   clonazePAM  (KLONOPIN ) 0.5 MG tablet Take 1 tablet (0.5 mg total) by mouth 3 (three) times daily as needed for anxiety.   Cyanocobalamin (B-12 PO) Take by mouth.   desvenlafaxine  (PRISTIQ ) 50 MG 24 hr tablet Take 1 tablet (50 mg total) by mouth daily.   estradiol  (DOTTI ) 0.075 MG/24HR Place 1 patch onto the skin 2 (two) times a week.   Estradiol  (IMVEXXY  MAINTENANCE PACK) 10 MCG INST Place 1 tablet vaginally 2 (two) times a week.   ibuprofen (ADVIL,MOTRIN) 200 MG tablet Take 400 mg by mouth every 6 (six) hours as needed.   MAGNESIUM PO Take by mouth.   Multiple Vitamins-Minerals (CENTRUM SILVER PO) Take by mouth.   ondansetron  (ZOFRAN -ODT) 4 MG disintegrating tablet Take 1 tablet (4 mg total) by mouth every 8 (eight) hours as needed for nausea or vomiting.   promethazine  (PHENERGAN ) 25 MG tablet Take 0.5-1 tablets (12.5-25 mg total) by mouth every 8 (eight) hours as needed for nausea or vomiting (migraine).   Pyridoxine HCl (VITAMIN B-6 PO) Take by mouth.   SUMAtriptan  (IMITREX ) 50 MG tablet Take 1 tablet (50 mg total) by mouth every 2 (two) hours as needed for migraine. May repeat in 2 hours if headache persists or recurs.   traZODone  (DESYREL ) 50 MG tablet Take 1 tablet (50 mg  total) by mouth at bedtime as needed for sleep.   VITAMIN D  PO Take by mouth.   [DISCONTINUED] albuterol  (VENTOLIN  HFA) 108 (90 Base) MCG/ACT inhaler Inhale 2 puffs into the lungs every 6 (six) hours as needed for wheezing or shortness of breath.   [DISCONTINUED] cetirizine (ZYRTEC) 10 MG tablet 1 tablet Orally Once a day; Duration: 30 days   [DISCONTINUED] propranolol  ER (INDERAL  LA) 60 MG 24 hr capsule Take 1 capsule (60 mg total) by mouth daily. For migraines   No facility-administered medications prior to visit.    Review of Systems  Constitutional:  Negative for chills, fatigue and fever.  HENT:  Positive for rhinorrhea and sneezing. Negative for congestion, ear pain and sore throat.   Eyes: Negative.  Negative for pain and redness.  Respiratory:  Positive for cough. Negative for shortness of breath and wheezing.   Cardiovascular:  Negative for chest pain, palpitations and leg swelling.  Gastrointestinal:  Positive for nausea. Negative for abdominal pain, blood in stool, constipation, diarrhea and vomiting.  Endocrine: Negative for polydipsia and polyphagia.  Genitourinary: Negative.  Negative for dysuria, flank pain, hematuria, pelvic pain, vaginal bleeding and vaginal discharge.  Musculoskeletal:  Negative for arthralgias, back pain, gait problem and joint swelling.  Skin:  Negative for rash.  Neurological:  Positive for light-headedness. Negative for dizziness, tremors, seizures, weakness, numbness and headaches.  Hematological:  Negative for adenopathy.  Psychiatric/Behavioral: Negative.  Negative for behavioral problems, confusion and dysphoric mood. The patient is not nervous/anxious and is not hyperactive.       Objective    BP 104/68 (BP Location: Right Arm, Patient Position: Sitting, Cuff Size: Normal)   Pulse 62   Resp 16   Ht 5' 6 (1.676 m)   Wt 135 lb 14.4 oz (61.6 kg)   SpO2 98%   BMI 21.93 kg/m  Physical Exam Vitals and nursing note reviewed.   Constitutional:      General: She is awake.     Appearance: Normal appearance.  HENT:     Head: Normocephalic and atraumatic.     Right Ear: Tympanic membrane, ear canal and external ear normal.     Left Ear: Tympanic membrane, ear canal and external ear normal.     Nose: Nose normal.     Mouth/Throat:     Mouth: Mucous membranes are moist.     Pharynx: Oropharynx is clear. No oropharyngeal exudate or posterior oropharyngeal erythema.  Eyes:     General: No scleral icterus.    Extraocular Movements: Extraocular movements intact.     Conjunctiva/sclera: Conjunctivae normal.     Pupils: Pupils are equal, round, and reactive to light.  Neck:     Thyroid: No thyromegaly or thyroid tenderness.  Cardiovascular:     Rate and Rhythm: Normal rate and regular rhythm.     Pulses: Normal pulses.     Heart sounds: Normal heart sounds.  Pulmonary:     Effort: Pulmonary effort is normal. No tachypnea, bradypnea or respiratory distress.     Breath sounds: Normal breath sounds. No stridor. No wheezing, rhonchi or rales.  Abdominal:     General: Bowel sounds are normal. There is no distension.     Palpations: Abdomen is soft. There is no mass.     Tenderness: There is no abdominal tenderness. There is no guarding.     Hernia: No hernia is present.  Musculoskeletal:     Cervical back: Normal range of motion and neck supple.     Right lower leg: No edema.     Left lower leg: No edema.  Lymphadenopathy:     Cervical: No cervical adenopathy.  Skin:    General: Skin is warm and dry.  Neurological:     Mental Status: She is alert and oriented to person, place, and time. Mental status is at baseline.  Psychiatric:        Mood and Affect: Mood normal.        Behavior: Behavior normal.      Last depression screening scores    04/15/2024    3:18 PM 02/10/2024   11:59 AM 12/29/2023    2:22 PM  PHQ 2/9 Scores  PHQ - 2 Score 0 1 3  PHQ- 9 Score 3 2 10    Last fall risk screening    04/15/2024     3:18 PM  Fall Risk   Falls in the past year? 1  Number falls in past yr: 0  Injury with Fall? 0  Risk for fall due to : No Fall Risks   Last Audit-C alcohol use screening    04/15/2024    3:49 PM  Alcohol Use Disorder Test (AUDIT)  1. How often do you have a drink containing alcohol? 3  2. How many drinks containing alcohol do you have on a typical day when you are drinking? 0  3. How often do you have six or more drinks on one occasion? 0  AUDIT-C Score 3  4. How often during the last year have you found that you were not able to stop drinking once you had started? 0  5. How often during the last year have you failed to do what was normally expected from you because of drinking? 0  6. How often during the last year have you needed a first drink in the morning to get yourself  going after a heavy drinking session? 0  7. How often during the last year have you had a feeling of guilt of remorse after drinking? 0  8. How often during the last year have you been unable to remember what happened the night before because you had been drinking? 0  9. Have you or someone else been injured as a result of your drinking? 0  10. Has a relative or friend or a doctor or another health worker been concerned about your drinking or suggested you cut down? 0  Alcohol Use Disorder Identification Test Final Score (AUDIT) 3   A score of 3 or more in women, and 4 or more in men indicates increased risk for alcohol abuse, EXCEPT if all of the points are from question 1   No results found for any visits on 04/15/24.  Assessment & Plan    Routine Health Maintenance and Physical Exam  Exercise Activities and Dietary recommendations  Goals   None     Immunization History  Administered Date(s) Administered   Hepb-cpg 02/10/2024, 04/15/2024   Influenza,inj,Quad PF,6+ Mos 04/16/2018, 04/27/2019, 06/05/2021   Influenza-Unspecified 04/14/2015, 05/06/2015, 04/23/2016   PFIZER Comirnaty(Gray Top)Covid-19  Tri-Sucrose Vaccine 12/27/2020   PFIZER(Purple Top)SARS-COV-2 Vaccination 11/10/2019, 02/13/2020, 12/27/2020   Tdap 09/11/2009, 03/01/2021   Zoster Recombinant(Shingrix ) 02/10/2024, 04/15/2024    Health Maintenance  Topic Date Due   Pneumococcal Vaccine: 50+ Years (1 of 1 - PCV) 05/02/2024 (Originally 05/29/2022)   Influenza Vaccine  10/11/2024 (Originally 02/12/2024)   COVID-19 Vaccine (5 - 2025-26 season) 03/14/2025 (Originally 03/14/2024)   Mammogram  10/25/2025   Cervical Cancer Screening (HPV/Pap Cotest)  11/07/2027   Colonoscopy  10/20/2030   DTaP/Tdap/Td (3 - Td or Tdap) 03/02/2031   Hepatitis B Vaccines 19-59 Average Risk  Completed   Hepatitis C Screening  Completed   HIV Screening  Completed   Zoster Vaccines- Shingrix   Completed   HPV VACCINES  Aged Out   Meningococcal B Vaccine  Aged Out    Discussed health benefits of physical activity, and encouraged her to engage in regular exercise appropriate for her age and condition.   Annual physical exam Assessment & Plan: Physical exam overall unremarkable except as noted above. Routine lab work ordered as noted.  Patient declined COVID, influenza and pneumonia vaccines.  She will be due for her mammogram in April 2026, cervical cancer screening in April 2029 and colonoscopy in April 2033.    Psychophysiological insomnia Assessment & Plan: Chronic, improved.  Recently decreased trazodone  to 25 mg nightly. Ok to continue decrease to 25 mg every other day and potentially stopping completely.   Chronic migraine without aura without status migrainosus, not intractable Assessment & Plan: Chronic, recent worsening led to self dose increase by patient to two tablets daily with good effect.  Continue propranolol  ER 120 mg daily.  Orders: -     Propranolol  HCl ER; Take 1 capsule (120 mg total) by mouth daily.  Dispense: 90 capsule; Refill: 3  Bronchitis -     Albuterol  Sulfate HFA; Inhale 2 puffs into the lungs every 6 (six) hours  as needed for wheezing or shortness of breath.  Dispense: 8 g; Refill: 2  Perennial allergic rhinitis with seasonal variation -     Cetirizine HCl; Take 0.5 tablets (5 mg total) by mouth daily.  Dispense: 90 tablet; Refill: 3  Immunization due -     Heplisav-B  (HepB-CPG) Vaccine -     Varicella-zoster vaccine IM      Return  in about 6 months (around 10/14/2024) for Chronic f/u.     I discussed the assessment and treatment plan with the patient  The patient was provided an opportunity to ask questions and all were answered. The patient agreed with the plan and demonstrated an understanding of the instructions.   The patient was advised to call back or seek an in-person evaluation if the symptoms worsen or if the condition fails to improve as anticipated.    LAURAINE LOISE BUOY, DO  [facility] [phone] (phone) [phone] (fax)  [facility]

## 2024-04-15 NOTE — Assessment & Plan Note (Signed)
 Chronic, improved.  Recently decreased trazodone  to 25 mg nightly. Ok to continue decrease to 25 mg every other day and potentially stopping completely.

## 2024-04-15 NOTE — Assessment & Plan Note (Signed)
 Chronic, recent worsening led to self dose increase by patient to two tablets daily with good effect.  Continue propranolol  ER 120 mg daily.

## 2024-04-25 ENCOUNTER — Encounter: Payer: Self-pay | Admitting: Family Medicine

## 2024-04-25 NOTE — Assessment & Plan Note (Signed)
 Physical exam overall unremarkable except as noted above. Routine lab work ordered as noted.  Patient declined COVID, influenza and pneumonia vaccines.  She will be due for her mammogram in April 2026, cervical cancer screening in April 2029 and colonoscopy in April 2033.

## 2024-05-20 ENCOUNTER — Other Ambulatory Visit: Payer: Self-pay | Admitting: Family Medicine

## 2024-05-20 DIAGNOSIS — F5104 Psychophysiologic insomnia: Secondary | ICD-10-CM

## 2024-05-20 DIAGNOSIS — F410 Panic disorder [episodic paroxysmal anxiety] without agoraphobia: Secondary | ICD-10-CM

## 2024-05-20 DIAGNOSIS — F330 Major depressive disorder, recurrent, mild: Secondary | ICD-10-CM

## 2024-05-25 ENCOUNTER — Other Ambulatory Visit: Payer: Self-pay | Admitting: Family Medicine

## 2024-05-25 DIAGNOSIS — F410 Panic disorder [episodic paroxysmal anxiety] without agoraphobia: Secondary | ICD-10-CM

## 2024-05-25 DIAGNOSIS — F330 Major depressive disorder, recurrent, mild: Secondary | ICD-10-CM

## 2024-05-25 DIAGNOSIS — F5104 Psychophysiologic insomnia: Secondary | ICD-10-CM

## 2024-05-25 NOTE — Telephone Encounter (Unsigned)
 Copied from CRM [id number]. Topic: Clinical - Medication Refill >> May 25, 2024  9:06 AM Laymon HERO wrote: Medication: desvenlafaxine  (PRISTIQ ) 50 MG 24 hr tablet  Has the patient contacted their pharmacy? Yes (Agent: If no, request that the patient contact the pharmacy for the refill. If patient does not wish to contact the pharmacy document the reason why and proceed with request.) (Agent: If yes, when and what did the pharmacy advise?)  This is the patient's preferred pharmacy:  [facility] - [place], [place] - [address] [place] [address] Phone: [phone] Fax: [phone]   Is this the correct pharmacy for this prescription? Yes If no, delete pharmacy and type the correct one.   Has the prescription been filled recently? Yes  Is the patient out of the medication? Yes  Has the patient been seen for an appointment in the last year OR does the patient have an upcoming appointment? Yes  Can we respond through MyChart? Yes  Agent: Please be advised that Rx refills may take up to 3 business days. We ask that you follow-up with your pharmacy.

## 2024-05-26 ENCOUNTER — Encounter: Payer: Self-pay | Admitting: Family Medicine

## 2024-05-26 DIAGNOSIS — F330 Major depressive disorder, recurrent, mild: Secondary | ICD-10-CM

## 2024-05-26 DIAGNOSIS — F410 Panic disorder [episodic paroxysmal anxiety] without agoraphobia: Secondary | ICD-10-CM

## 2024-05-26 DIAGNOSIS — F5104 Psychophysiologic insomnia: Secondary | ICD-10-CM

## 2024-05-27 ENCOUNTER — Other Ambulatory Visit: Payer: Self-pay | Admitting: Family Medicine

## 2024-05-27 DIAGNOSIS — F410 Panic disorder [episodic paroxysmal anxiety] without agoraphobia: Secondary | ICD-10-CM

## 2024-05-27 DIAGNOSIS — F5104 Psychophysiologic insomnia: Secondary | ICD-10-CM

## 2024-05-27 DIAGNOSIS — F330 Major depressive disorder, recurrent, mild: Secondary | ICD-10-CM

## 2024-05-27 NOTE — Telephone Encounter (Signed)
 Copied from CRM #[id number]. Topic: Clinical - Prescription Issue >> May 27, 2024 12:50 PM Joanna Cervantes wrote: Reason for CRM: Patient is out of desvenlafaxine  (PRISTIQ ) 50 MG 24 hr tablet and needs a refill ASAP.  Callback #: [id number]  Pharmacy:  [facility] - [place], [place] - [address] [place] [address] Phone: [phone] Fax: [phone] Hours: Not open 24 hours

## 2024-05-27 NOTE — Telephone Encounter (Signed)
 Too soon for refill.  Requested Prescriptions  Pending Prescriptions Disp Refills   desvenlafaxine  (PRISTIQ ) 50 MG 24 hr tablet 90 tablet 3    Sig: Take 1 tablet (50 mg total) by mouth daily.     Psychiatry: Antidepressants - SNRI - desvenlafaxine  & venlafaxine  Failed - 05/27/2024  9:17 AM      Failed - Cr in normal range and within 360 days    Creatinine  Date Value Ref Range Status  08/02/2011 0.80 0.60 - 1.30 mg/dL Final   Creatinine, Ser  Date Value Ref Range Status  10/21/2022 0.78 0.57 - 1.00 mg/dL Final         Failed - Lipid Panel in normal range within the last 12 months    Cholesterol, Total  Date Value Ref Range Status  10/21/2022 209 (H) 100 - 199 mg/dL Final   LDL Chol Calc (NIH)  Date Value Ref Range Status  10/21/2022 116 (H) 0 - 99 mg/dL Final   HDL  Date Value Ref Range Status  10/21/2022 67 >39 mg/dL Final   Triglycerides  Date Value Ref Range Status  10/21/2022 151 (H) 0 - 149 mg/dL Final         Passed - Completed PHQ-2 or PHQ-9 in the last 360 days      Passed - Last BP in normal range    BP Readings from Last 1 Encounters:  04/15/24 104/68         Passed - Valid encounter within last 6 months    Recent Outpatient Visits           1 month ago Annual physical exam   [facility] [place], Sarah N, DO   3 months ago 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   [facility] [place], Lauraine SAILOR, DO   5 months ago 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

## 2024-05-28 MED ORDER — DESVENLAFAXINE SUCCINATE ER 50 MG PO TB24: 50.0000 mg | ORAL_TABLET | Freq: Every day | ORAL | 3 refills | Status: AC

## 2024-07-28 ENCOUNTER — Other Ambulatory Visit: Payer: Self-pay | Admitting: Family Medicine

## 2024-07-28 DIAGNOSIS — F5104 Psychophysiologic insomnia: Secondary | ICD-10-CM

## 2024-07-30 ENCOUNTER — Other Ambulatory Visit: Payer: Self-pay | Admitting: Family Medicine

## 2024-08-14 ENCOUNTER — Other Ambulatory Visit: Payer: Self-pay | Admitting: Family Medicine

## 2024-08-14 DIAGNOSIS — F5104 Psychophysiologic insomnia: Secondary | ICD-10-CM

## 2024-08-14 DIAGNOSIS — F330 Major depressive disorder, recurrent, mild: Secondary | ICD-10-CM

## 2024-08-14 DIAGNOSIS — F4310 Post-traumatic stress disorder, unspecified: Secondary | ICD-10-CM

## 2024-10-17 ENCOUNTER — Ambulatory Visit: Admitting: Family Medicine
